# Patient Record
Sex: Female | Born: 1985 | Race: White | Hispanic: Yes | Marital: Married | Smoking: Never smoker
Health system: Southern US, Community
[De-identification: ages and names within clinical notes are randomized; demographics above are authoritative.]

## PROBLEM LIST (undated history)

## (undated) ENCOUNTER — Inpatient Hospital Stay (HOSPITAL_COMMUNITY): Payer: Self-pay

## (undated) DIAGNOSIS — E119 Type 2 diabetes mellitus without complications: Secondary | ICD-10-CM

## (undated) DIAGNOSIS — Z789 Other specified health status: Secondary | ICD-10-CM

## (undated) HISTORY — DX: Other specified health status: Z78.9

## (undated) HISTORY — DX: Type 2 diabetes mellitus without complications: E11.9

---
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
## 2003-02-12 ENCOUNTER — Inpatient Hospital Stay (HOSPITAL_COMMUNITY): Admission: AD | Admit: 2003-02-12 | Discharge: 2003-02-14 | Payer: Self-pay | Admitting: Family Medicine

## 2004-06-22 ENCOUNTER — Ambulatory Visit (HOSPITAL_COMMUNITY): Admission: RE | Admit: 2004-06-22 | Discharge: 2004-06-22 | Payer: Self-pay | Admitting: *Deleted

## 2004-10-04 ENCOUNTER — Ambulatory Visit: Payer: Self-pay | Admitting: Obstetrics and Gynecology

## 2004-10-04 ENCOUNTER — Inpatient Hospital Stay (HOSPITAL_COMMUNITY): Admission: AD | Admit: 2004-10-04 | Discharge: 2004-10-06 | Payer: Self-pay | Admitting: *Deleted

## 2006-03-12 ENCOUNTER — Ambulatory Visit (HOSPITAL_COMMUNITY): Admission: RE | Admit: 2006-03-12 | Discharge: 2006-03-12 | Payer: Self-pay | Admitting: Obstetrics & Gynecology

## 2006-04-13 ENCOUNTER — Ambulatory Visit: Payer: Self-pay | Admitting: Obstetrics and Gynecology

## 2006-04-13 ENCOUNTER — Inpatient Hospital Stay (HOSPITAL_COMMUNITY): Admission: AD | Admit: 2006-04-13 | Discharge: 2006-04-13 | Payer: Self-pay | Admitting: Obstetrics and Gynecology

## 2006-04-23 ENCOUNTER — Ambulatory Visit (HOSPITAL_COMMUNITY): Admission: RE | Admit: 2006-04-23 | Discharge: 2006-04-23 | Payer: Self-pay | Admitting: Obstetrics & Gynecology

## 2006-08-06 ENCOUNTER — Ambulatory Visit: Payer: Self-pay | Admitting: *Deleted

## 2006-08-06 ENCOUNTER — Inpatient Hospital Stay (HOSPITAL_COMMUNITY): Admission: AD | Admit: 2006-08-06 | Discharge: 2006-08-07 | Payer: Self-pay | Admitting: Obstetrics & Gynecology

## 2007-05-25 ENCOUNTER — Emergency Department (HOSPITAL_COMMUNITY): Admission: EM | Admit: 2007-05-25 | Discharge: 2007-05-25 | Payer: Self-pay | Admitting: Emergency Medicine

## 2009-11-29 ENCOUNTER — Ambulatory Visit (HOSPITAL_COMMUNITY)
Admission: RE | Admit: 2009-11-29 | Discharge: 2009-11-29 | Payer: Self-pay | Source: Home / Self Care | Admitting: Family Medicine

## 2009-12-27 ENCOUNTER — Ambulatory Visit (HOSPITAL_COMMUNITY)
Admission: RE | Admit: 2009-12-27 | Discharge: 2009-12-27 | Payer: Self-pay | Source: Home / Self Care | Attending: Family Medicine | Admitting: Family Medicine

## 2010-02-07 ENCOUNTER — Ambulatory Visit (HOSPITAL_COMMUNITY)
Admission: RE | Admit: 2010-02-07 | Discharge: 2010-02-07 | Payer: Self-pay | Source: Home / Self Care | Attending: Family Medicine | Admitting: Family Medicine

## 2010-04-26 ENCOUNTER — Other Ambulatory Visit: Payer: Self-pay | Admitting: Family Medicine

## 2010-04-27 ENCOUNTER — Ambulatory Visit (HOSPITAL_COMMUNITY)
Admission: RE | Admit: 2010-04-27 | Discharge: 2010-04-27 | Disposition: A | Discharge status: Home / Self Care | Payer: Medicaid Other | Source: Ambulatory Visit | Attending: Family Medicine | Admitting: Family Medicine

## 2010-04-27 DIAGNOSIS — Z3689 Encounter for other specified antenatal screening: Secondary | ICD-10-CM | POA: Insufficient documentation

## 2010-04-27 DIAGNOSIS — O3660X0 Maternal care for excessive fetal growth, unspecified trimester, not applicable or unspecified: Secondary | ICD-10-CM | POA: Insufficient documentation

## 2010-04-30 ENCOUNTER — Inpatient Hospital Stay (HOSPITAL_COMMUNITY): Admission: AD | Admit: 2010-04-30 | Payer: Self-pay | Admitting: Obstetrics & Gynecology

## 2010-05-01 ENCOUNTER — Other Ambulatory Visit: Payer: Self-pay | Admitting: Family Medicine

## 2010-05-01 DIAGNOSIS — O480 Post-term pregnancy: Secondary | ICD-10-CM

## 2010-05-01 DIAGNOSIS — IMO0002 Reserved for concepts with insufficient information to code with codable children: Secondary | ICD-10-CM

## 2010-05-03 ENCOUNTER — Inpatient Hospital Stay (HOSPITAL_COMMUNITY)
Admission: AD | Admit: 2010-05-03 | Discharge: 2010-05-03 | Disposition: A | Discharge status: Home / Self Care | Payer: Medicaid Other | Source: Ambulatory Visit | Attending: Family Medicine | Admitting: Family Medicine

## 2010-05-03 DIAGNOSIS — O4690 Antepartum hemorrhage, unspecified, unspecified trimester: Secondary | ICD-10-CM | POA: Insufficient documentation

## 2010-05-04 ENCOUNTER — Inpatient Hospital Stay (HOSPITAL_COMMUNITY)
Admission: AD | Admit: 2010-05-04 | Discharge: 2010-05-06 | DRG: 774 | Disposition: A | Discharge status: Home / Self Care | Payer: Medicaid Other | Source: Ambulatory Visit | Attending: Obstetrics and Gynecology | Admitting: Obstetrics and Gynecology

## 2010-05-04 DIAGNOSIS — O3660X0 Maternal care for excessive fetal growth, unspecified trimester, not applicable or unspecified: Secondary | ICD-10-CM | POA: Diagnosis present

## 2010-05-04 LAB — CBC
HCT: 34.3 % — ABNORMAL LOW (ref 36.0–46.0)
Hemoglobin: 11.1 g/dL — ABNORMAL LOW (ref 12.0–15.0)
MCH: 27.3 pg (ref 26.0–34.0)
MCHC: 32.4 g/dL (ref 30.0–36.0)
MCV: 84.5 fL (ref 78.0–100.0)
Platelets: 167 10*3/uL (ref 150–400)
RBC: 4.06 MIL/uL (ref 3.87–5.11)
RDW: 15 % (ref 11.5–15.5)
WBC: 10.5 10*3/uL (ref 4.0–10.5)

## 2010-05-04 LAB — RPR: RPR Ser Ql: NONREACTIVE

## 2010-05-04 LAB — ABO/RH: ABO/RH(D): O POS

## 2010-05-04 LAB — GLUCOSE, CAPILLARY: Glucose-Capillary: 134 mg/dL — ABNORMAL HIGH (ref 70–99)

## 2010-05-05 ENCOUNTER — Other Ambulatory Visit (HOSPITAL_COMMUNITY): Payer: Medicaid Other

## 2010-05-05 ENCOUNTER — Ambulatory Visit (HOSPITAL_COMMUNITY): Admission: RE | Admit: 2010-05-05 | Payer: Medicaid Other | Source: Ambulatory Visit

## 2010-05-05 LAB — CBC
HCT: 31.9 % — ABNORMAL LOW (ref 36.0–46.0)
Hemoglobin: 10.1 g/dL — ABNORMAL LOW (ref 12.0–15.0)
MCH: 26.9 pg (ref 26.0–34.0)
MCHC: 31.7 g/dL (ref 30.0–36.0)
MCV: 84.8 fL (ref 78.0–100.0)
Platelets: 173 10*3/uL (ref 150–400)
RBC: 3.76 MIL/uL — ABNORMAL LOW (ref 3.87–5.11)
RDW: 15.6 % — ABNORMAL HIGH (ref 11.5–15.5)
WBC: 15.8 10*3/uL — ABNORMAL HIGH (ref 4.0–10.5)

## 2010-05-05 LAB — CREATININE, SERUM
Creatinine, Ser: 0.58 mg/dL (ref 0.4–1.2)
GFR calc Af Amer: >60 mL/min (ref >60)
GFR calc non Af Amer: >60 mL/min (ref >60)

## 2010-05-12 NOTE — Op Note (Signed)
  NAMEALCIE, RUNIONS        ACCOUNT NO.:  [id number]  MEDICAL RECORD NO.:  [id number]           PATIENT TYPE:  I  LOCATION:  9103                          FACILITY:  [facility]  PHYSICIAN:  Lucina Mellow, DO   DATE OF BIRTH:  09-19-1985  DATE OF PROCEDURE:  05/04/2010 DATE OF DISCHARGE:                              OPERATIVE REPORT   The patient is a 25 year old gravida 4, para 3-0-0-3 at 40.2 weeks who presented in active labor.  The patient had been noted to have a large for gestational age baby on an ultrasound 1 week ago way weighing at 9 pounds 3 ounces.   The patient had been pushing for approximately 20 minutes and was noted to have a small amount of "turtling" and additional help was in the room at the time of delivery.  The patient was pushing with 4 milliunits per minute of Pitocin running in her IV.  There was 1 minute 33 seconds of shoulder dystocia. The time of birth was 12:48. The infant presention for delivery was in a right occiput anterior position.  The infant's face was looking towards the mother's left leg such that the anterior shoulder could not be delivered.  I placed my hand into the vagina posteriorly, was able to deliver the posterior arm. The infant was then rotated clockwise and delivered the shoulders almost exactly transverse. Baby came out and did not cry, and was limp, and was handed immediately to the pediatricians. The Apgars are still being decided and determined at the time of this dictation.    There had been thick meconium noted at [facility] when the patient was determined to be complete at 9:35 this morning.  The pediatrician stated that they did not pull any meconium past the vocal cord.  The infant eventually cried and was noted to be moving his right arm a small amount but his left arm very little is it all.  Blood gases, both arterial and venous were obtained as well as the cord blood sample was obtained.  The placenta delivered  spontaneously and intact at 12:55.  Exploration noted that the uterus was somewhat boggy.  Clots were removed and exploration of the fundus without difficulty; 1000 mcg of Cytotec were placed rectally.  A first degree perineal tear was repaired in the usual fashion with 3-0 Vicryl.  Estimated blood loss was 750 mL.  After the repair, the uterus was noted to be firm and no more clots were to be removed.  Infant's weight was 10 pounds even. Mother plans to breastfeed, 24 hours of antibiotics of clindamycin and gentamicin will be provided due to the internal uterine exploration for clot removal. The patient has been counseled that if subsequent pregnancies determined to be this large, she may be offered a primary cesarean section to avoid shoulder dystocia of subsequent babies.  The patient voiced understanding at this time but we will review this upon discharge.          ______________________________ Lucina Mellow, DO     SH/MEDQ  D:  05/04/2010  T:  05/05/2010  Job:  [id number]  Electronically Signed by Lucina Mellow MD on 05/12/2010 07:57:06 PM

## 2010-10-23 LAB — CBC
HCT: 33.1 — ABNORMAL LOW
Hemoglobin: 11.2 — ABNORMAL LOW
MCHC: 33.9
MCV: 87.5
Platelets: 204
RBC: 3.78 — ABNORMAL LOW
RDW: 13.9
WBC: 8.8

## 2010-10-23 LAB — RPR: RPR Ser Ql: NONREACTIVE

## 2011-05-29 IMAGING — US US OB DETAIL+14 WK
2 series · 16 of 28 positions shown · non-contrast
Comparison: none

[Series 1: us ob detail +14 wk · 15 of 101 slices shown (1 of 2)]
[im 1/101]
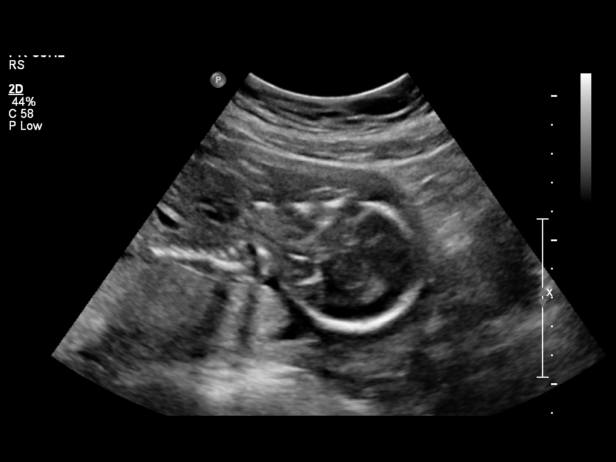
[im 8/101]
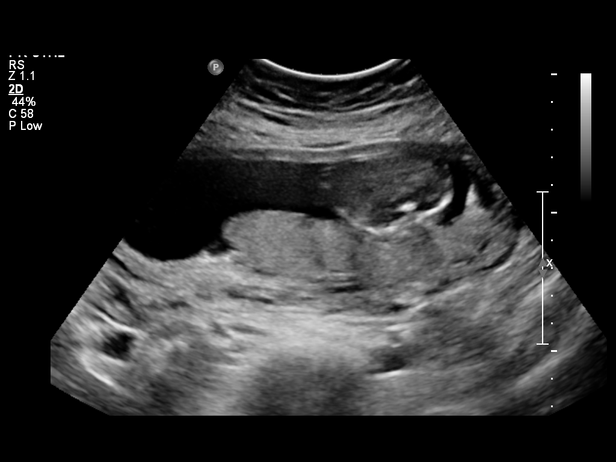
[im 16/101]
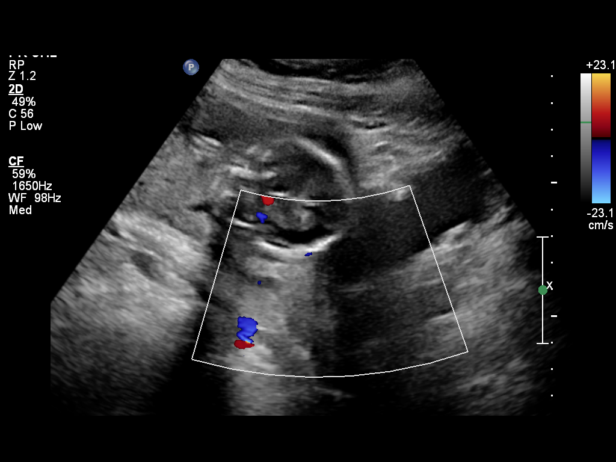
[im 24/101]
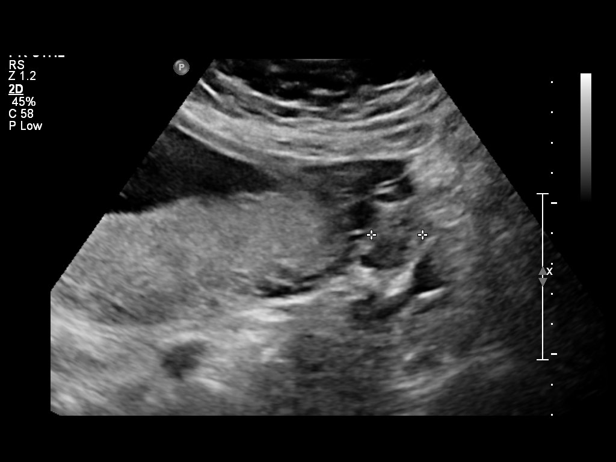
[im 27/101]
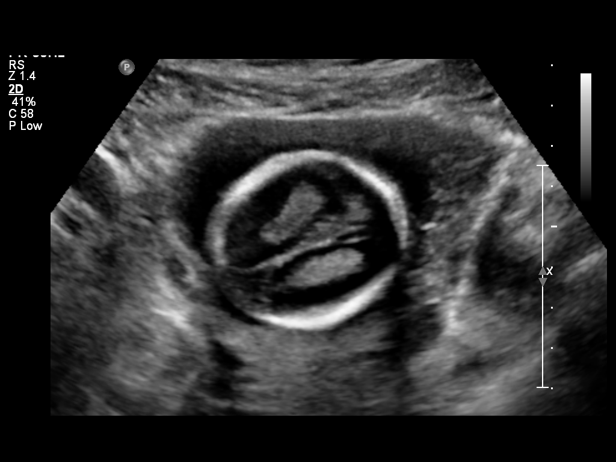
[im 35/101]
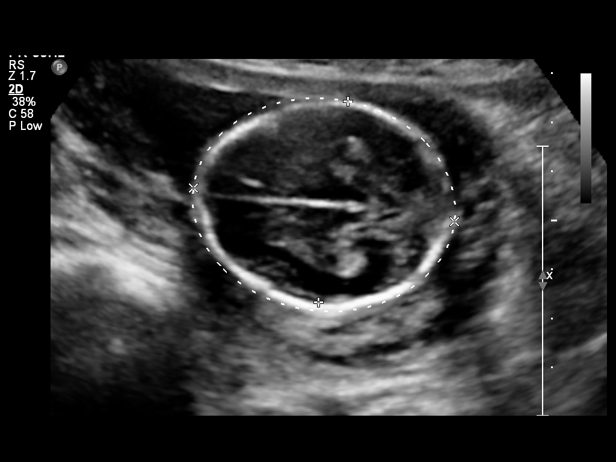
[im 43/101]
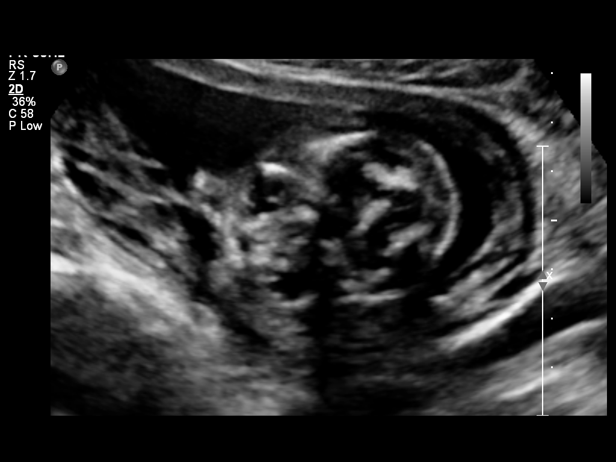
[im 51/101]
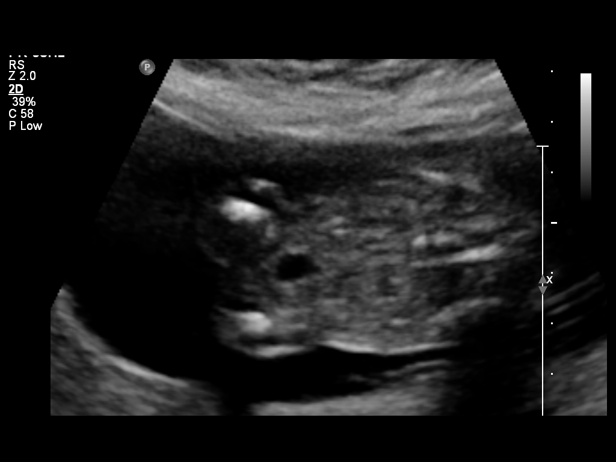
[im 54/101]
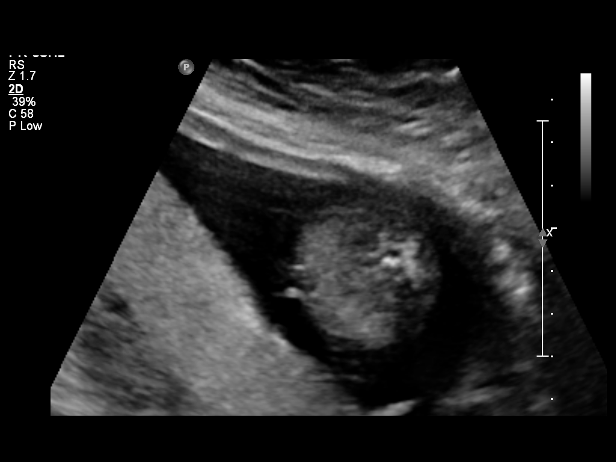
[im 62/101]
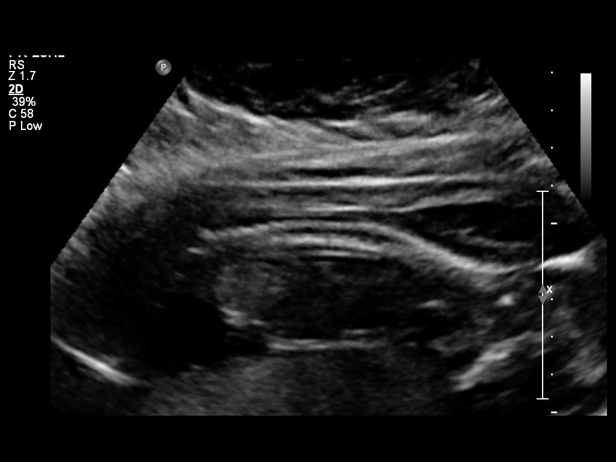
[im 70/101]
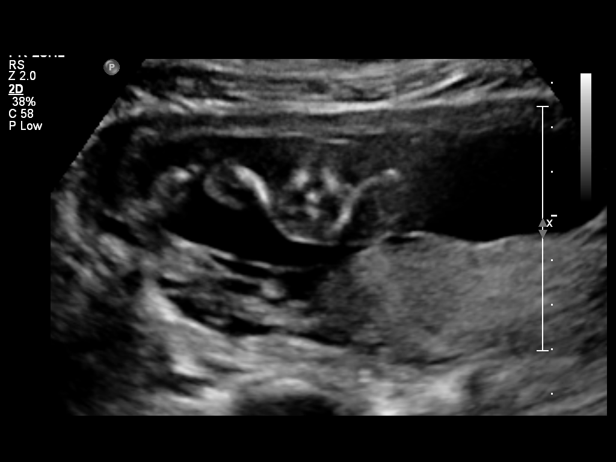
[im 77/101]
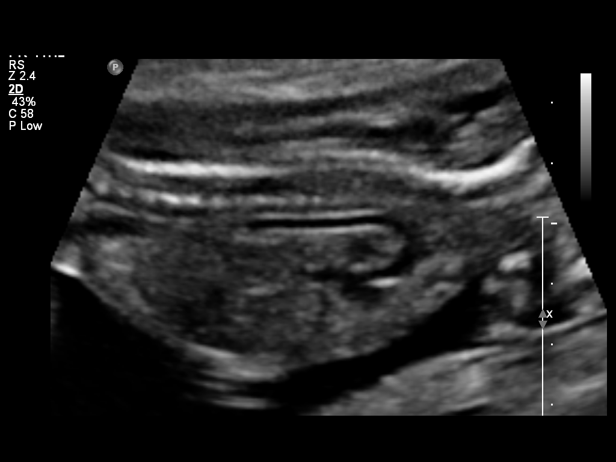
[im 81/101]
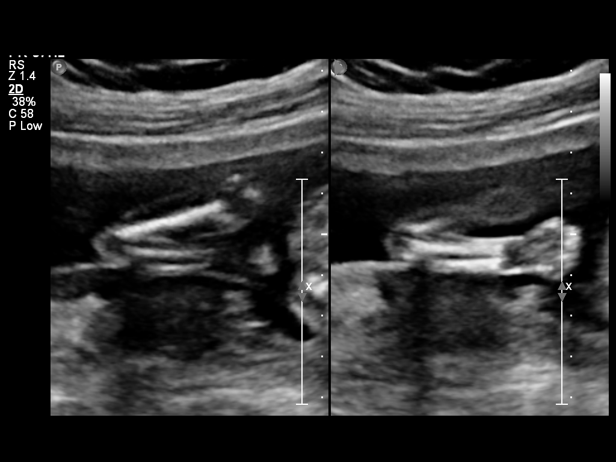
[im 89/101]
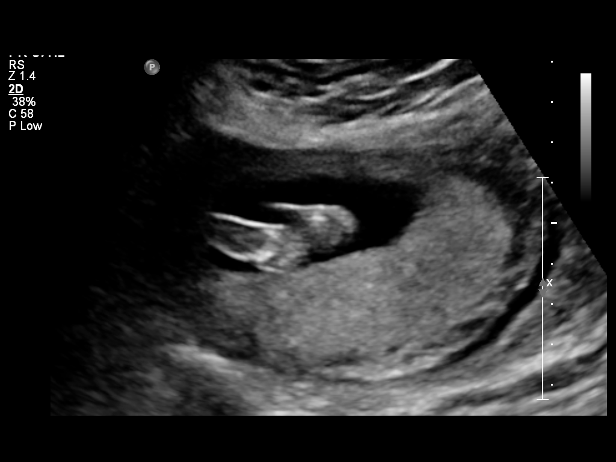
[im 97/101]
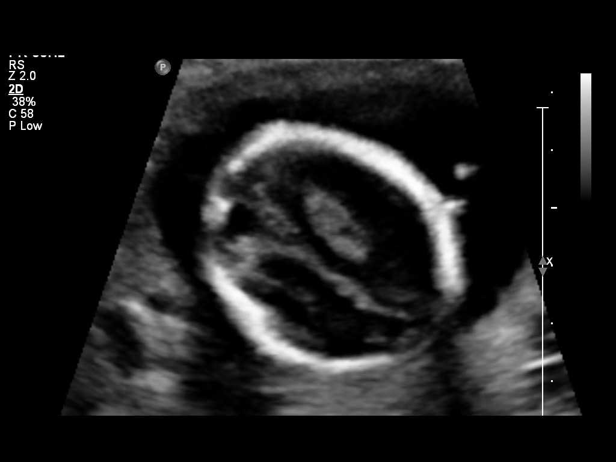

[Series 1: us ob detail +14 wk · 1 of 4 slices shown (2 of 2)]
[im 1/4]
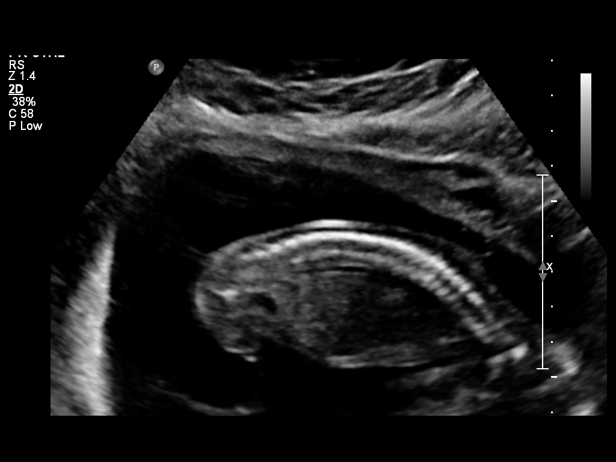

[16 of 28 positions shown; findings below may reference images not displayed]

OBSTETRICS REPORT
                      (Signed Final 11/29/2009 [DATE])

                 [REDACTED]-
                 Faculty Physician
 Order#:         _[id number]
Procedures

 US OB DETAIL +14 WK                                   76811.0
Indications

 Detailed fetal anatomic survey
 Unsure of LMP;  Establish Gestational [AGE]
 Cholecystectomy
Fetal Evaluation

 Fetal Heart Rate:  142                          bpm
 Cardiac Activity:  Observed
 Presentation:      Cephalic
 Placenta:          Posterior, above cervical
                    os
 P. Cord            Visualized
 Insertion:

 Amniotic Fluid
 AFI FV:      Subjectively within normal limits
                                             Larg Pckt:       4  cm
Biometry

 BPD:     41.1  mm     G. Age:  18w 3d                CI:        74.71   70 - 86
                                                      FL/HC:      17.2   15.8 -
                                                                         18
 HC:     150.9  mm     G. Age:  18w 1d       35  %    HC/AC:      1.21   1.07 -

 AC:     124.2  mm     G. Age:  18w 0d       39  %    FL/BPD:
 FL:      25.9  mm     G. Age:  17w 6d       29  %    FL/AC:      20.9   20 - 24
 HUM:     25.1  mm     G. Age:  17w 6d       41  %
 CER:       18  mm     G. Age:  18w 0d       39  %

 Est. FW:     219  gm      0 lb 8 oz     41  %
Gestational Age

 LMP:           18w 2d        Date:  07/24/09                 EDD:   04/30/10
 U/S Today:     18w 1d                                        EDD:   05/01/10
 Best:          18w 2d     Det. By:  LMP  (07/24/09)          EDD:   04/30/10
Genetic Sonogram - Trisomy 21 Screening

 Age:                                             26          Risk=1:   826
 Echogenic bowel:                                 No          LR :
 Choroid plexus cysts:                            Yes
 Structural anomalies (inc. cardiac):             No          LR :
 Short femur:                                     No          LR :
 Wide space 0st-5nd toes:                         No
 Short humerus:                                   No          LR :
 2-vessel umbilical cord:                         No
 Pyelectasis:                                     No          LR :
 Echogenic cardiac foci:                          No          LR :

 9 Of 9 Criteria Were Visualized and 1 Abnormal(s) Were Seen.
 Ultrasound Modified Risk for Fetal Down Syndrome = [DATE]
Anatomy

 Cranium:           Appears normal      Aortic Arch:       Appears normal
 Fetal Cavum:       Appears normal      Ductal Arch:       Appears normal
 Ventricles:        Appears normal      Diaphragm:         Appears normal
 Choroid Plexus:    Bilateral           Stomach:           Appears
                    choroid plexus
                    cysts
                                                           normal, left
                                                           sided
 Cerebellum:        Appears normal      Abdomen:           Appears normal
 Posterior Fossa:   Appears normal      Abdominal Wall:    Appears nml
                                                           (cord insert,
                                                           abd wall)
 Nuchal Fold:       Appears normal      Cord Vessels:      Appears normal
                    (neck, nuchal                          (3 vessel cord)
                    fold)
 Face:              Orbits appear       Kidneys:           Appear normal
                    normal
 Heart:             Appears normal      Bladder:           Appears normal
                    (4 chamber &
                    axis)
 RVOT:              Not well            Spine:             Appears normal
                    visualized
 LVOT:              Appears normal      Limbs:             Four extremities
                                                           seen

 Other:     Fetus appears to be a male. Heels visualized.  Choroid
            plexus cysts are associated with mildly increased risk for
            Trisomy 18, but are seen in 1-2% of normal fetuses.  As
            an isolated finding, this is of doubtful clinical significance.
Cervix Uterus Adnexa

 Cervical Length:    3.5      cm

 Cervix:       Normal appearance by transabdominal scan.

 Left Ovary:    Within normal limits.
 Right Ovary:   Within normal limits.
 Adnexa:     No abnormality visualized.
Impression

 cbd  Single living intrauterine gestation with concordant
 gestational age and normal visualized anatomy.

   Incidental note is made of 2mm choroid plexus cysts.
 These can be associated with mildly increased risk for
 Trisomy 18, but are seen in 1-2% of normal fetuses.  As an
 isolated finding, this is of doubtful clinical significance.

 AMAZIGH with us.  Please do not hesitate to contact

## 2011-10-25 IMAGING — US US OB FOLLOW-UP
1 series · 12 of 28 positions shown · non-contrast
Comparison: none

[Series 1: us ob follow up · 34 acquisitions, 12 frames shown]
[im 2/34]
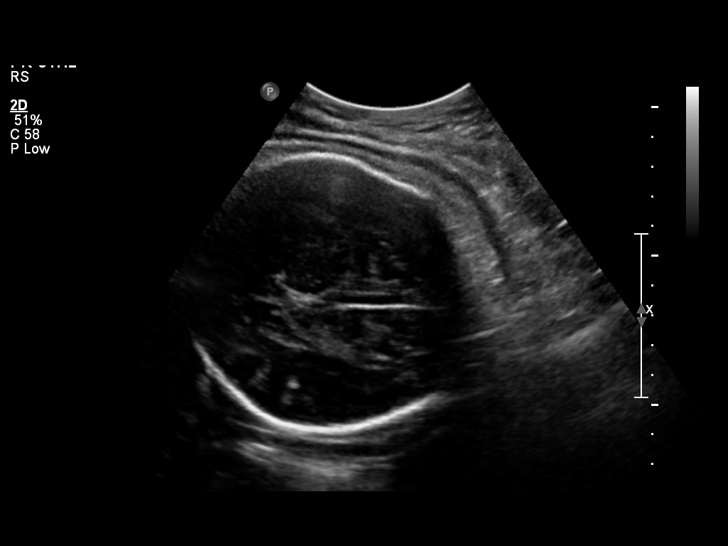
[im 4/34]
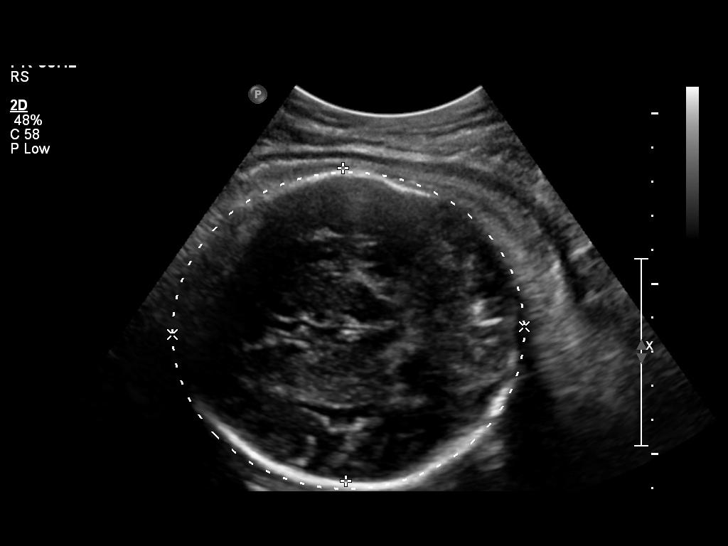
[im 7/34]
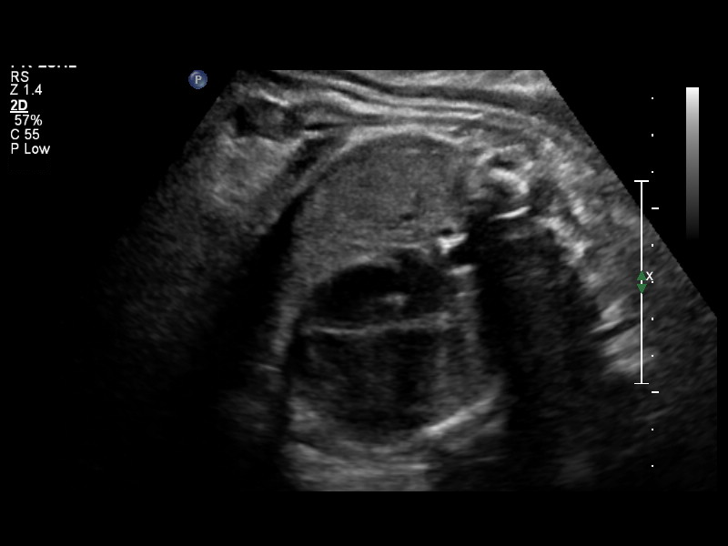
[im 10/34]
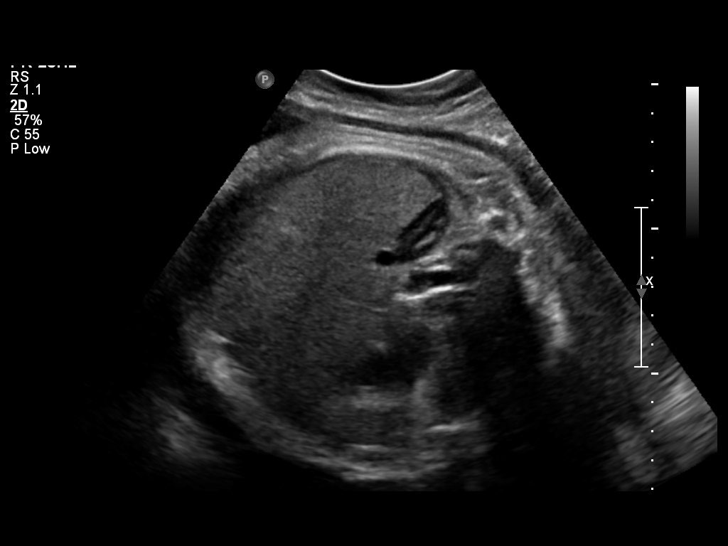
[im 13/34]
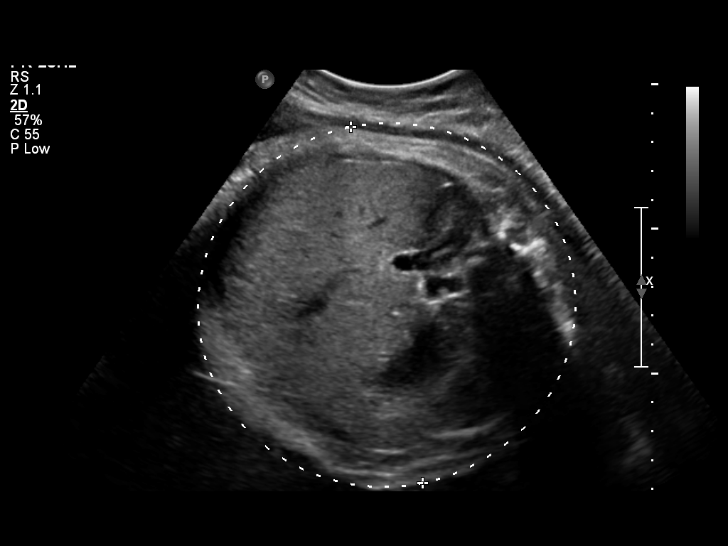
[im 15/34]
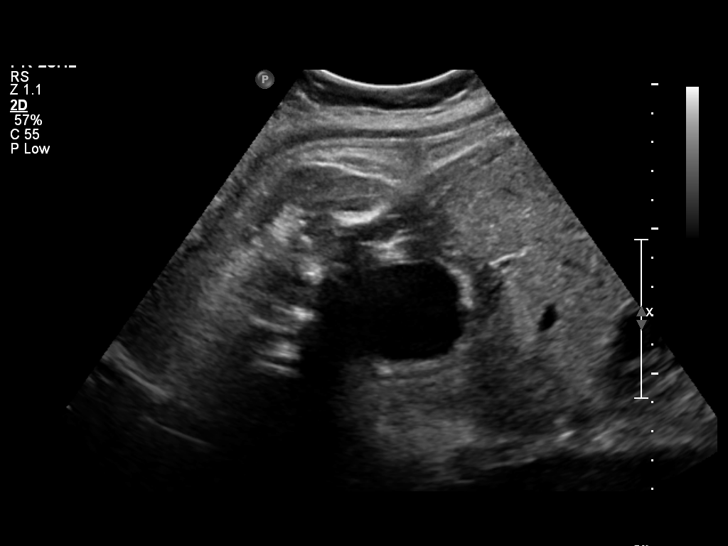
[im 19/34]
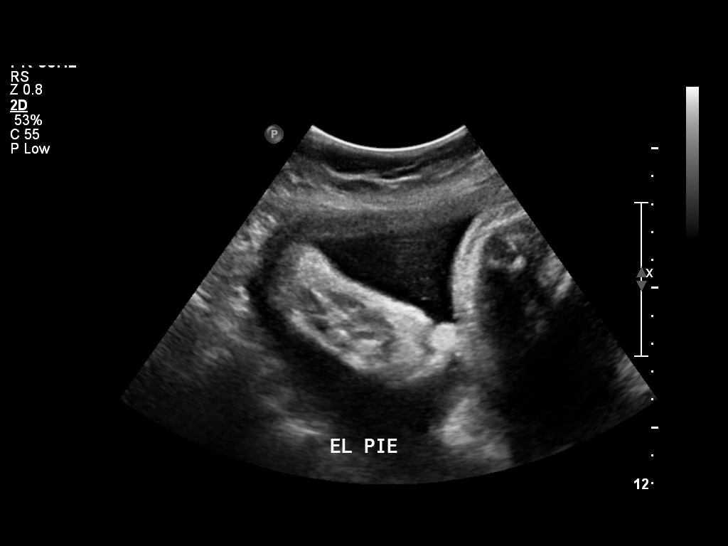
[im 21/34]
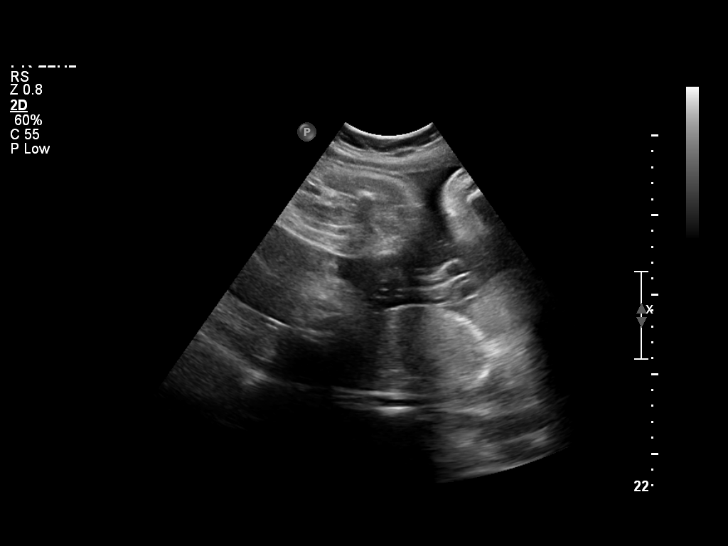
[im 24/34]
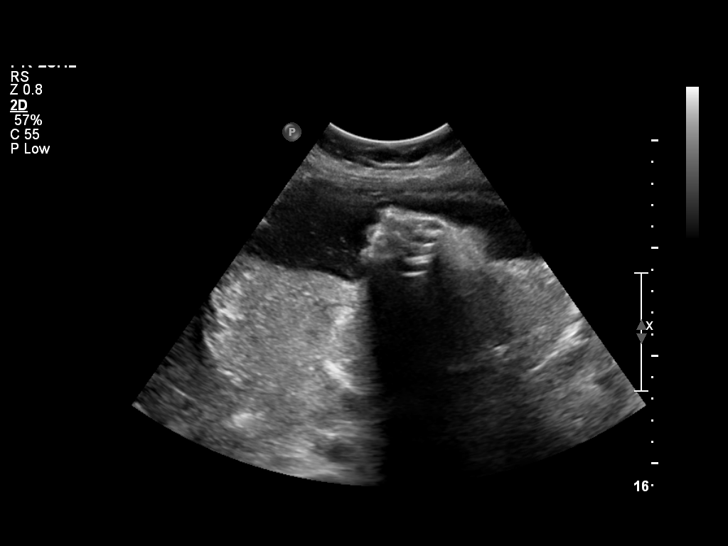
[im 27/34]
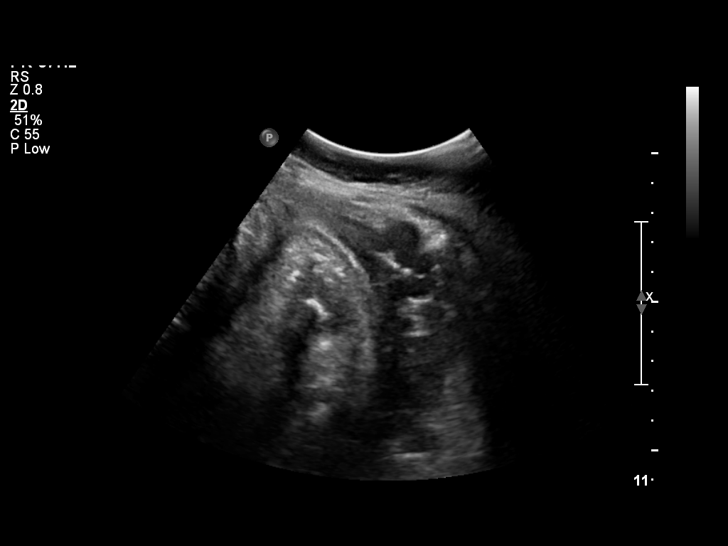
[im 30/34]
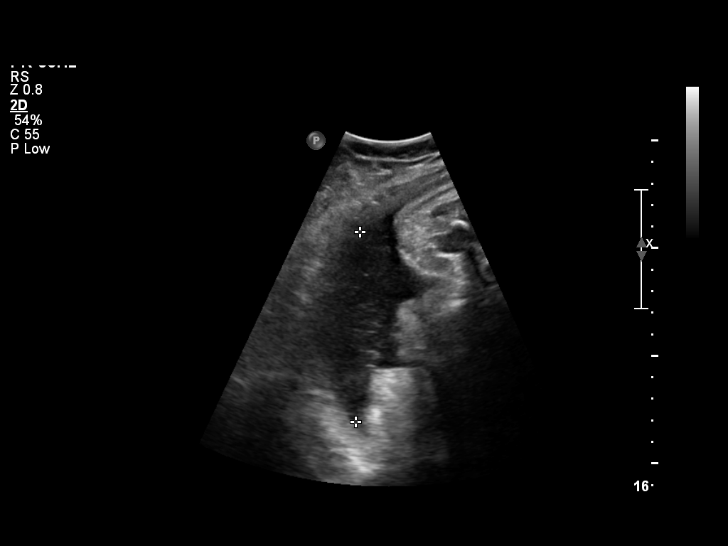
[im 32/34]
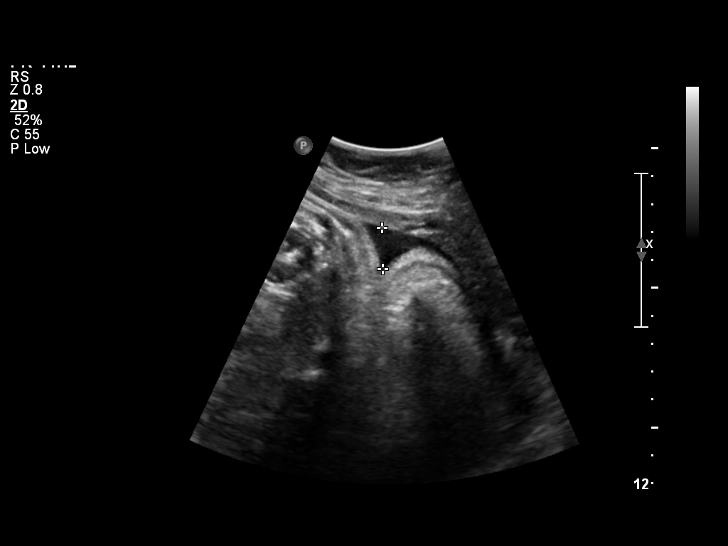

[12 of 28 positions shown; findings below may reference images not displayed]

OBSTETRICS REPORT
                      (Signed Final 04/27/2010 [DATE])

           IRVIN

 Order#:         [id number]
Procedures

 US OB FOLLOW UP                                       76816.1
Indications

 Assess Fetal Growth / Estimated Fetal Weight
 Size greater than dates (Large for gestational [AGE]
Fetal Evaluation

 Fetal Heart Rate:  143                          bpm
 Cardiac Activity:  Observed
 Presentation:      Cephalic
 Placenta:          Posterior, above cervical
                    os
 P. Cord            Previously Visualized
 Insertion:

 Amniotic Fluid
 AFI FV:      Subjectively within normal limits
 AFI Sum:     13.77   cm       56  %Tile     Larg Pckt:    8.79  cm
 RUQ:   3.51    cm   RLQ:    1.47   cm    LLQ:   8.79    cm
Biometry

 BPD:     90.6  mm     G. Age:  36w 5d                CI:         82.1   70 - 86
                                                      FL/HC:      23.3   20.7 -

 HC:     315.5  mm     G. Age:  35w 3d      < 3  %    HC/AC:      0.79   0.87 -

 AC:     399.1  mm     G. Age:  N/A        > 97  %    FL/BPD:     81.2   71 - 87
 FL:      73.6  mm     G. Age:  37w 5d       17  %    FL/AC:      18.4   20 - 24

 Est. FW:    0665  gm      9 lb 3 oz   > 90  %
Gestational Age

 LMP:           39w 4d        Date:  07/24/09                 EDD:   04/30/10
 U/S Today:     36w 4d                                        EDD:   05/21/10
 Best:          39w 4d     Det. By:  LMP  (07/24/09)          EDD:   04/30/10
Anatomy

 Cranium:           Appears normal      Aortic Arch:       Previously seen
 Fetal Cavum:       Previously seen     Ductal Arch:       Previously seen
 Ventricles:        Appears normal      Diaphragm:         Previously seen
 Choroid Plexus:    Previously seen     Stomach:           Appears
                                                           normal, left
                                                           sided
 Cerebellum:        Previously seen     Abdomen:           Appears normal
 Posterior Fossa:   Previously seen     Abdominal Wall:    Previously seen
 Nuchal Fold:       Previously seen     Cord Vessels:      Previously seen
 Face:              Previously seen     Kidneys:           Appear normal
 Heart:             Not well            Bladder:           Appears normal
                    visualized
 RVOT:              Previously seen     Spine:             Previously seen
 LVOT:              Previously seen     Limbs:             Previously seen

 Other:     Heels previously visualized. Technically difficult due to
            fetal position and advanced GA.
Cervix Uterus Adnexa

 Cervix:       Not visualized (advanced GA >34 wks)
 Uterus:       No abnormality visualized.
 Left Ovary:    Not visualized.
 Right Ovary:   Within normal limits.

 Adnexa:     No abnormality visualized.
Comments

 EFW range (+ / - 15%) =  3554 - 4597 grs.
Impression

 Single intrauterine gestation demonstrating an estimated
 gestational age by ultrasound of 36w 4d. This is correlated
 with expected estimated gestational age by LMP of 39w 4d.
 The AC is larger than the remaining gestational indicators
 resulting in an EFW > 90% and compatible with a LGA fetus.
 With an EFW range exceeding 8755 grams, the possibility of
 underlying fetal macrosomia would need to be raised.

 No late developing fetal anatomic abnormalities are noted
 associated with the lateral ventricles, stomach, kidneys or
 bladder. The four chamber heart could not be well assessed
 due to fetal positioning combined with advanced gestational
 age.

 Subjectively and quantitatively normal amniotic fluid volume.

## 2013-03-07 ENCOUNTER — Encounter (HOSPITAL_COMMUNITY): Payer: Self-pay | Admitting: Emergency Medicine

## 2013-03-07 ENCOUNTER — Emergency Department (INDEPENDENT_AMBULATORY_CARE_PROVIDER_SITE_OTHER)
Admission: EM | Admit: 2013-03-07 | Discharge: 2013-03-07 | Disposition: A | Discharge status: Home / Self Care | Payer: Self-pay | Source: Home / Self Care | Attending: Emergency Medicine | Admitting: Emergency Medicine

## 2013-03-07 DIAGNOSIS — L739 Follicular disorder, unspecified: Secondary | ICD-10-CM

## 2013-03-07 DIAGNOSIS — L259 Unspecified contact dermatitis, unspecified cause: Secondary | ICD-10-CM

## 2013-03-07 MED ORDER — CEPHALEXIN 500 MG PO CAPS: 500.0000 mg | ORAL_CAPSULE | Freq: Three times a day (TID) | ORAL | Status: DC

## 2013-03-07 MED ORDER — HYDROCORTISONE 1 % EX CREA: TOPICAL_CREAM | CUTANEOUS | Status: DC

## 2013-03-07 NOTE — Discharge Instructions (Signed)
Dermatitis de contacto (Contact Dermatitis) La dermatitis de contacto es una reaccin a ciertas sustancias que tocan la piel. Puede ser Ardelia Mems dermatitis de contacto irritante o alrgica. La dermatitis de contacto irritante no requiere exposicin previa a la sustancia que provoc la reaccin.La dermatitis alrgica slo ocurre si ha estado expuesto anteriormente a la sustancia. Al repetir la exposicin, el organismo reacciona a la sustancia.  CAUSAS  Muchas sustancias pueden causar dermatitis de contacto. La dermatitis irritante se produce cuando hay exposicin repetida a sustancias levemente irritantes, como por ejemplo:   Maquillaje.  Jabones.  Detergentes.  Lavandina.  cidos.  Sales metlicas, como el nquel. Las causas de la dermatitis alrgica son:   Plantas venenosas.  Sustancias qumicas (desodorantes, champs).  Bijouterie.  Ltex.  Neomicina en cremas con triple antibitico.  Conservantes en productos incluyendo en la ropa. SNTOMAS  En la zona de la piel que ha estado expuesta puede haber:   Sequedad o descamacin.  Enrojecimiento.  Grietas.  Picazn.  Dolor o sensacin de ardor.  Ampollas. En el caso de la dermatitis de Risk manager, puede haber slo hinchazn en algunas zonas, como la boca o los genitales.  DIAGNSTICO  El mdico podr hacer el diagnstico realizando un examen fsico. En los casos en que la causa es incierta y se sospecha una dermatitis de [place], le har una prueba en la piel con un parche para determinar la causa de la dermatitis. TRATAMIENTO  El tratamiento incluye la proteccin de la piel de nuevos contactos con la sustancia irritante, evitando la sustancia en lo posible. Puede ser de utilidad colocar una barrera como cremas, polvos y [place]. El mdico tambin podr recomendar:   Cremas o pomadas con corticoides aplicadas 2 veces por da. Para un mejor efecto, humedezca la zona con agua fresca durante 20 minutos. Luego aplique  el medicamento. Cubra la zona con un vendaje plstico. Puede almacenar la crema con corticoides en el refrigerador para Research scientist (medical) "refrescante" sobre la erupcin que har aliviar la picazn. Esto aliviar la picazn. En los casos ms graves ser necesario aplicar corticoides por va oral.  Ungentos con antibiticos o antibacterianos, si hay una infeccin en la piel.  Antihistamnicos en forma de locin o por va oral para calmar la picazn.  Lubricantes para mantener la humectacin de la piel.  La solucin de [place] para reducir el enrojecimiento y Conservation officer, historic buildings o para secar una erupcin que supura. Mezcle un paquete o tableta en dos tazas de agua fra. Moje un pao limpio en la solucin, escrralo un poco y colquelo en el rea afectada. Djelo en el lugar durante 30 minutos. Repita el procedimiento todas las veces que pueda a lo largo del Training and development officer.  Hgase baos con almidn o bicarbonato todos los das si la zona es demasiado extensa como para cubrirla con una toallita. Algunas sustancias qumicas, como los lcalis o los cidos pueden daar la piel del mismo modo que [place]. Enjuague la piel durante 15 a 20 minutos con agua fra despus de la exposicin a esas sustancias. Tambin busque atencin mdica de inmediato. En los casos de piel muy irritada, ser necesario aplicar (vendajes), antibiticos y analgsicos.  INSTRUCCIONES PARA EL CUIDADO EN EL HOGAR   Evite lo que ha causado la erupcin.  Mantenga el rea de la piel afectada sin contacto con el agua caliente, el jabn, la luz solar, las sustancias qumicas, sustancias cidas o todo lo que la irrite.  No se rasque la lesin. El rascado [employer] la  erupcin se infecte.  Puede tomar baos con agua fresca para detener la picazn.  Tome slo medicamentos de venta libre o recetados, segn las indicaciones del mdico.  Concurra a las visitas de control segn las indicaciones, para asegurarse de que la piel se est curando  adecuadamente. SOLICITE ATENCIN MDICA SI:   El problema no mejora luego de 3 das de tratamiento.  Se siente empeorar.  Observa signos de infeccin, como hinchazn, sensibilidad, inflamacin, enrojecimiento o aumenta la temperatura en la zona afectada.  Tiene nuevos problemas debido a los medicamentos. Document Released: 10/04/2004 Document Revised: 03/19/2011 [employer] Patient Information 2014 [employer].  

## 2013-03-07 NOTE — ED Notes (Signed)
Pt. Stated, I've had a rash on my nose since Thursday. Today it seems worse.

## 2013-03-07 NOTE — ED Provider Notes (Signed)
  Chief Complaint    Chief Complaint  Patient presents with  . Rash    History of Present Illness      Drema Halonlicia Delarosa is a 28 year old female who had a small, inflamed bump on the bridge of the nose 3 days ago. She but applied some homeopathic remedy, then tried Neosporin. Since then there is a rash surrounding the inflamed bump. There's been no purulent drainage.  Review of Systems   Other than as noted above, the patient denies any of the following symptoms: Systemic:  No fever, chills, or myalgias. ENT:  No nasal congestion, rhinorrhea, sore throat, swelling of lips, tongue or throat. Resp:  No cough, wheezing, or shortness of breath.  PMFSH    Past medical history, family history, social history, meds, and allergies were reviewed.   Physical Exam     Vital signs:  LMP 03/05/2013 Gen:  Alert, oriented, in no distress. ENT:  Pharynx clear, no intraoral lesions, moist mucous membranes. Lungs:  Clear to auscultation. Skin:  She has a couple of small complaint bumps on her face. The biggest of these in the bridge of the nose and has a crust on it. There is some surrounding erythema, and a fine maculopapular rash extending about a centimeter in all.  Assessment    The primary encounter diagnosis was Contact dermatitis. A diagnosis of Folliculitis was also pertinent to this visit.  I think the patient had a small pimple or area of folliculitis. The medicine she put on it treated have caused her to break out.  Plan     1.  Meds:  The following meds were prescribed:   Discharge Medication List as of 03/07/2013 10:32 AM    START taking these medications   Details  cephALEXin (KEFLEX) 500 MG capsule Take 1 capsule (500 mg total) by mouth 3 (three) times daily., Starting 03/07/2013, Until Discontinued, Normal    hydrocortisone cream 1 % Apply to affected area 3 times daily, Normal        2.  Patient Education/Counseling:  The patient was given appropriate handouts, self  care instructions, and instructed in symptomatic relief.  She was told not to use any of the previous medicines that she had been using. Suggested moist, warm compresses, given Keflex and hydrocortisone cream.  3.  Follow up:  The patient was told to follow up here if no better in 3 to 4 days, or sooner if becoming worse in any way, and given some red flag symptoms such as worsening rash, fever, or difficulty breathing which would prompt immediate return.  Follow up here if necessary.      Reuben Likesavid C Senie Lanese, MD 03/07/13 1131

## 2020-08-19 ENCOUNTER — Encounter (HOSPITAL_COMMUNITY): Payer: Self-pay | Admitting: Emergency Medicine

## 2020-08-19 ENCOUNTER — Other Ambulatory Visit: Payer: Self-pay

## 2020-08-19 ENCOUNTER — Ambulatory Visit (HOSPITAL_COMMUNITY)
Admission: EM | Admit: 2020-08-19 | Discharge: 2020-08-19 | Disposition: A | Discharge status: Home / Self Care | Payer: Self-pay | Attending: Student | Admitting: Student

## 2020-08-19 DIAGNOSIS — L0100 Impetigo, unspecified: Secondary | ICD-10-CM

## 2020-08-19 MED ORDER — MUPIROCIN CALCIUM 2 % EX CREA: 1.0000 "application " | TOPICAL_CREAM | Freq: Two times a day (BID) | CUTANEOUS | 0 refills | Status: AC

## 2020-08-19 MED ORDER — METHYLPREDNISOLONE SODIUM SUCC 125 MG IJ SOLR
60.0000 mg | Freq: Once | INTRAMUSCULAR | Status: AC
  Administered 2020-08-19: 60 mg via INTRAMUSCULAR

## 2020-08-19 MED ORDER — METHYLPREDNISOLONE SODIUM SUCC 125 MG IJ SOLR
INTRAMUSCULAR | Status: AC
  Filled 2020-08-19: qty 2

## 2020-08-19 NOTE — ED Triage Notes (Addendum)
Facial erythematous, dry, itching rash ongoing x 3 days. Denies pain, drainage. Isolated, raised patches. Denies fever. Says the last time something similar happened, this rash broke out over her cheeks and she was seen at the hospital 16 years ago. Says they treated her for allergies, reports having been given a prescription for it and the rash improved.

## 2020-08-19 NOTE — Discharge Instructions (Addendum)
-  Mupirocin cream 2x daily x7 days -Wash with gentle soap and water only -Come back and see [place] if symptoms getting worse instead of better

## 2020-08-19 NOTE — ED Provider Notes (Signed)
[facility]    CSN: [id number] Arrival date & time: 08/19/20  1015      History   Chief Complaint Chief Complaint  Patient presents with   Rash    HPI Gabriela Rivera is a 35 y.o. female presenting with rash x3 days. Medical history similar in the past though she does not know the diagnosis or the treatment that was given. Describes this as an itchy facial rash.  Denies triggers including new products, outdoor exposure.  Denies pain, drainage, rash elsewhere.  Denies fever/chills.  Has not tried any medications for the symptoms.  HPI  History reviewed. No pertinent past medical history.  There are no problems to display for this patient.   History reviewed. No pertinent surgical history.  OB History   No obstetric history on file.      Home Medications    Prior to Admission medications   Medication Sig Start Date End Date Taking? Authorizing Provider  mupirocin cream (BACTROBAN) 2 % Apply 1 application topically 2 (two) times daily for 7 days. 08/19/20 08/26/20 Yes Rhys Martini, PA-C  cephALEXin (KEFLEX) 500 MG capsule Take 1 capsule (500 mg total) by mouth 3 (three) times daily. 03/07/13   Reuben Likes, MD  hydrocortisone cream 1 % Apply to affected area 3 times daily 03/07/13   Reuben Likes, MD    Family History History reviewed. No pertinent family history.  Social History Social History   Tobacco Use   Smoking status: Never   Smokeless tobacco: Never  Substance Use Topics   Alcohol use: No   Drug use: No     Allergies   Patient has no known allergies.   Review of Systems Review of Systems  Skin:  Positive for rash.  All other systems reviewed and are negative.   Physical Exam Triage Vital Signs ED Triage Vitals  Enc Vitals Group     BP 08/19/20 1034 130/79     Pulse Rate 08/19/20 1034 68     Resp 08/19/20 1034 16     Temp 08/19/20 1034 98.4 F (36.9 C)     Temp Source 08/19/20 1034 Oral     SpO2 08/19/20 1034 99 %      Weight --      Height --      Head Circumference --      Peak Flow --      Pain Score 08/19/20 1036 0     Pain Loc --      Pain Edu? --      Excl. in GC? --    No data found.  Updated Vital Signs BP 130/79 (BP Location: Right Arm)   Pulse 68   Temp 98.4 F (36.9 C) (Oral)   Resp 16   SpO2 99%   Visual Acuity Right Eye Distance:   Left Eye Distance:   Bilateral Distance:    Right Eye Near:   Left Eye Near:    Bilateral Near:     Physical Exam Vitals reviewed.  Constitutional:      General: She is not in acute distress.    Appearance: Normal appearance. She is not ill-appearing or diaphoretic.  HENT:     Head: Normocephalic and atraumatic.  Cardiovascular:     Rate and Rhythm: Normal rate and regular rhythm.     Heart sounds: Normal heart sounds.  Pulmonary:     Effort: Pulmonary effort is normal.     Breath sounds: Normal breath sounds.  Skin:    General: Skin is warm.     Comments: See images below Face with few patches of warm erythematous raised rash. No fluctuance. No discharge or crusting.  Neurological:     General: No focal deficit present.     Mental Status: She is alert and oriented to person, place, and time.  Psychiatric:        Mood and Affect: Mood normal.        Behavior: Behavior normal.        Thought Content: Thought content normal.        Judgment: Judgment normal.         UC Treatments / Results  Labs (all labs ordered are listed, but only abnormal results are displayed) Labs Reviewed - No data to display  EKG   Radiology No results found.  Procedures Procedures (including critical care time)  Medications Ordered in UC Medications  methylPREDNISolone sodium succinate (SOLU-MEDROL) 125 mg/2 mL injection 60 mg (60 mg Intramuscular Given 08/19/20 1241)    Initial Impression / Assessment and Plan / UC Course  I have reviewed the triage vital signs and the nursing notes.  Pertinent labs & imaging results that were  available during my care of the patient were reviewed by me and considered in my medical decision making (see chart for details).     This patient is a very pleasant 35 y.o. year old female presenting with facial rash. Afebrile, nontachy. No triggers identified. Ddx is impetigo vs rosacesa. Solumedrol administered today. Mupirocin sent. ED return precautions discussed. Patient verbalizes understanding and agreement.    Final Clinical Impressions(s) / UC Diagnoses   Final diagnoses:  Impetigo     Discharge Instructions      -Mupirocin cream 2x daily x7 days -Wash with gentle soap and water only -Come back and see [place] if symptoms getting worse instead of better     ED Prescriptions     Medication Sig Dispense Auth. Provider   mupirocin cream (BACTROBAN) 2 % Apply 1 application topically 2 (two) times daily for 7 days. 15 g Rhys Martini, PA-C      PDMP not reviewed this encounter.   Rhys Martini, PA-C 08/19/20 1307

## 2021-01-08 NOTE — L&D Delivery Note (Signed)
OB/GYN Faculty Practice Delivery Note  Gabriela Rivera is a 36 y.o. G5P4004 s/p vag del at [redacted]w[redacted]d. She was admitted for IOL due to GDMA2.   ROM: 0h 21m with clear/bloody fluid GBS Status: pos (rec'd PCN x 2 doses) Maximum Maternal Temperature: 98.4  Labor Progress: Gabriela Rivera was admitted in the afternoon of Nov 29th for IOL due to [facility]; she also was AMA and GBS+; she had a double dose of cytotec (50/25) followed by Pitocin then AROM prior to progressing rapidly to vag del.  Delivery Date/Time: November 29th, 2023 at 2158 Delivery: Called to room and patient was complete and pushing. Head delivered LOA. No nuchal cord present. Shoulder and body delivered in usual fashion. Infant with spontaneous cry, placed on mother's abdomen, dried and stimulated. Cord clamped x 2 after 1-minute delay, and cut by daughter of pt. Cord blood drawn. Placenta delivered spontaneously with gentle cord traction. Fundus firm with massage and Pitocin. Labia, perineum, vagina, and cervix inspected  and found to be intact.   Placenta: spont, intact; to L&D Complications: none Lacerations: none (sm perineal abrasion, not repaired) EBL: 267cc Analgesia: none  Postpartum Planning [x]  message to sent to schedule follow-up   Infant: girl  APGARs 8/9  3204g (7lb 1oz)  10/9, CNM  12/06/2021 10:23 PM

## 2021-06-12 ENCOUNTER — Ambulatory Visit (INDEPENDENT_AMBULATORY_CARE_PROVIDER_SITE_OTHER): Payer: Medicaid Other

## 2021-06-12 VITALS — BP 125/74 | HR 80 | Ht 62.0 in | Wt 178.4 lb

## 2021-06-12 DIAGNOSIS — Z3A12 12 weeks gestation of pregnancy: Secondary | ICD-10-CM

## 2021-06-12 DIAGNOSIS — O3680X0 Pregnancy with inconclusive fetal viability, not applicable or unspecified: Secondary | ICD-10-CM | POA: Diagnosis not present

## 2021-06-12 DIAGNOSIS — O0990 Supervision of high risk pregnancy, unspecified, unspecified trimester: Secondary | ICD-10-CM | POA: Insufficient documentation

## 2021-06-12 DIAGNOSIS — Z3480 Encounter for supervision of other normal pregnancy, unspecified trimester: Secondary | ICD-10-CM | POA: Diagnosis not present

## 2021-06-12 MED ORDER — GOJJI WEIGHT SCALE MISC: 1.0000 | 0 refills | Status: DC

## 2021-06-12 MED ORDER — BLOOD PRESSURE KIT DEVI: 1.0000 | 0 refills | Status: DC

## 2021-06-12 NOTE — Progress Notes (Signed)
New OB Intake  I connected with  Gabriela Rivera on 06/12/21 at 10:15 AM EDT by in person and verified that I am speaking with the correct person using two identifiers. Nurse is located at [facility] and pt is located at [place].  I discussed the limitations, risks, security and privacy concerns of performing an evaluation and management service by telephone and the availability of in person appointments. I also discussed with the patient that there may be a patient responsible charge related to this service. The patient expressed understanding and agreed to proceed.  I explained I am completing New OB Intake today. We discussed her EDD of 12/23/21 that is based on LMP of 03/18/21. Pt is G5/P4004. I reviewed her allergies, medications, Medical/Surgical/OB history, and appropriate screenings. I informed her of [facility] services. Based on history, this is a/an  pregnancy uncomplicated .   There are no problems to display for this patient.   Concerns addressed today  Delivery Plans:  Plans to deliver at [facility].   MyChart/Babyscripts MyChart access verified. I explained pt will have some visits in office and some virtually. Babyscripts instructions given and order placed. Patient verifies receipt of registration text/e-mail. Account successfully created and app downloaded.  Blood Pressure Cuff  Blood pressure cuff ordered for patient to pick-up from [employer]. Explained after first prenatal appt pt will check weekly and document in Babyscripts.  Weight scale: Patient  does not  have weight scale. Weight scale ordered for patient to pick up from [employer].   Anatomy US Explained first scheduled [place] will be around 19 weeks.Dating and viability scan performed today.  Labs Discussed Avelina Laine genetic screening with patient. Would like both Panorama and Horizon drawn at new OB visit.Also if interested in genetic testing, tell patient she will need AFP 15-21 weeks to complete genetic  testing .Routine prenatal labs needed.  Covid Vaccine Patient has covid vaccine.   Is patient a CenteringPregnancy candidate?  Declined Declined due to Group Setting Not a candidate due to Not interested Centering Patient" indicated on sticky note   Is patient interested in [place]?  No  "Interested in [employer] - Schedule next visit with CNM" on sticky note  Informed patient of [facility] website  and placed link in her AVS.   Social Determinants of Health Food Insecurity: Patient denies food insecurity. WIC Referral: Patient is interested in referral to [facility].  Transportation: Patient denies transportation needs. Childcare: Discussed no children allowed at ultrasound appointments. Offered childcare services; patient declines childcare services at this time.  Send link to Pregnancy Navigators   Placed OB Box on problem list and updated  First visit review I reviewed new OB appt with pt. I explained she will have a pelvic exam, ob bloodwork with genetic screening, and PAP smear. Explained pt will be seen by Nettie Elm at first visit; encounter routed to appropriate provider. Explained that patient will be seen by pregnancy navigator following visit with provider. [facility] information placed in AVS.   Hamilton Capri, RN 06/12/2021  10:06 AM

## 2021-06-21 ENCOUNTER — Encounter: Payer: Self-pay | Admitting: Obstetrics and Gynecology

## 2021-06-21 ENCOUNTER — Ambulatory Visit (INDEPENDENT_AMBULATORY_CARE_PROVIDER_SITE_OTHER): Payer: Medicaid Other | Admitting: Obstetrics and Gynecology

## 2021-06-21 ENCOUNTER — Other Ambulatory Visit (HOSPITAL_COMMUNITY)
Admission: RE | Admit: 2021-06-21 | Discharge: 2021-06-21 | Disposition: A | Discharge status: Home / Self Care | Payer: Medicaid Other | Source: Ambulatory Visit | Attending: Obstetrics and Gynecology | Admitting: Obstetrics and Gynecology

## 2021-06-21 DIAGNOSIS — Z3A13 13 weeks gestation of pregnancy: Secondary | ICD-10-CM

## 2021-06-21 DIAGNOSIS — Z3482 Encounter for supervision of other normal pregnancy, second trimester: Secondary | ICD-10-CM

## 2021-06-21 DIAGNOSIS — Z3480 Encounter for supervision of other normal pregnancy, unspecified trimester: Secondary | ICD-10-CM

## 2021-06-21 DIAGNOSIS — R87619 Unspecified abnormal cytological findings in specimens from cervix uteri: Secondary | ICD-10-CM | POA: Insufficient documentation

## 2021-06-21 NOTE — Patient Instructions (Signed)
Primer trimestre de embarazo First Trimester of Pregnancy  El primer trimestre de embarazo comienza el primer da de su ltimo periodo menstrual hasta el final de la semana 12. Tambin se dice que va desde el mes 1 hasta el mes 3 de embarazo. Durante el primer trimestre, ocurren cambios en el cuerpo Su organismo atraviesa por muchos cambios durante el embarazo. En general, los cambios vuelven a la normalidad despus del nacimiento del beb. Cambios fsicos Usted puede aumentar o bajar de peso. Las mamas pueden agrandarse y doler. La zona que rodea los pezones puede oscurecerse. Pueden aparecer zonas oscuras o manchas en el rostro. Tal vez haya cambios en el cabello. Cambios en la salud Es posible que se sienta como si fuera a vomitar (nuseas) y quizs vomite. Es posible que tenga acidez estomacal. Es posible que tenga dolores de cabeza. Es posible que tenga dificultades para defecar (estreimiento). Las encas pueden sangrarle. Otros cambios Es posible que se canse con facilidad. Es posible que haga pis (orine) con mayor frecuencia. Los perodos menstruales se interrumpirn. Es posible que no tenga hambre. Es posible que quiera comer ciertos tipos de alimentos. Puede tener cambios en sus emociones de un da para otro. Es posible que tenga ms sueos. Siga estas instrucciones en su casa: Medicamentos Use los medicamentos de venta libre y los recetados solamente como se lo haya indicado el mdico. Algunos medicamentos no son seguros durante el embarazo. Tome vitaminas prenatales que contengan por lo menos 600 microgramos (mcg) de cido flico. Comida y bebida Consuma comidas saludables que incluyan lo siguiente: Frutas y verduras frescas. Cereales integrales. Buenas fuentes de protenas, como carne, huevos y tofu. Productos lcteos con bajo contenido de grasa. Evite la carne cruda y el jugo, la leche y el queso sin pasteurizar. Si se siente como si fuera a vomitar: Ingiera 4 o 5  comidas pequeas por da en lugar de 3 abundantes. Intente comer algunas galletitas saladas. Beba lquidos entre las comidas, en lugar de hacerlo durante estas. Es posible que deba tomar medidas para prevenir o tratar los problemas para defecar: Beber suficiente lquido para mantener el pis (orina) de color amarillo plido. Come alimentos ricos en fibra. Entre ellos, frijoles, cereales integrales y frutas y verduras frescas. Limitar los alimentos con alto contenido de grasa y azcar. Estos incluyen alimentos fritos o dulces. Actividad Haga ejercicios solamente como se lo haya indicado el mdico. La mayora de las personas pueden realizar su rutina de ejercicios habitual durante el embarazo. Deje de hacer ejercicios si tiene clicos o dolor en la parte baja del vientre (abdomen) o en la cintura. No haga ejercicio si hace demasiado calor, hay demasiada humedad o se encuentra en un lugar de mucha altura (altitud elevada). Evite levantar pesos excesivos. Si lo desea, puede continuar teniendo relaciones sexuales, a menos que el mdico le indique lo contrario. Alivio del dolor y del malestar Use un sostn que le brinde buen soporte si le duelen las mamas. Descanse con las piernas levantadas (elevadas) si tiene calambres en las piernas o dolor en la parte baja de la espalda. Si tiene las venas de las piernas abultadas (venas varicosas): Use medias de compresin segn las indicaciones de su mdico. Levante los pies durante 15 minutos, 3 o 4 veces por da. Limite la sal en sus alimentos. Seguridad Use el cinturn de seguridad en todo momento mientras vaya en auto. Hable con el mdico si alguien le est haciendo dao o gritando mucho. Hable con el mdico si se siente triste   o tiene pensamientos acerca de hacerse dao a usted misma. Estilo de vida No se d baos de inmersin en agua caliente, baos turcos ni saunas. No se haga duchas vaginales. No use tampones ni toallas higinicas perfumadas. No  consuma medicamentos a base de hierbas, drogas ilegales, ni medicamentos que el mdico no haya autorizado. No beba alcohol. No fume ni consuma ningn producto que contenga nicotina o tabaco. Si necesita ayuda para dejar de fumar, consulte al mdico. Evite el contacto con las bandejas sanitarias de los gatos y la tierra que estos animales usan. Estos contienen grmenes que pueden daar al beb y causar la prdida del beb ya sea aborto espontneo o muerte fetal. Instrucciones generales Cumpla con todas las visitas de seguimiento. Esto es importante. Pida ayuda si necesita asesoramiento o asistencia con la alimentacin. El mdico puede aconsejarla o indicarle dnde recurrir para recibir ayuda. Visite al dentista. En su casa, lvese los dientes con un cepillo dental suave. Psese el hilo dental suavemente. Escriba sus preguntas. Llvelas cuando concurra a las visitas prenatales. Dnde buscar ms informacin American Pregnancy Association (Asociacin Americana del Embarazo): americanpregnancy.org American College of Obstetricians and Gynecologists (Colegio Estadounidense de Obstetras y Gineclogos): www.acog.org Office on Women's Health (Oficina para la Salud de la Mujer): womenshealth.gov/pregnancy Comunquese con un mdico si: Tiene mareos. Tiene fiebre. Tiene clicos leves o siente presin en la parte baja del vientre. Sufre un dolor persistente en el abdomen. Sigue sintiendo como si fuera a vomitar, vomita o hace deposiciones acuosas (diarrea) durante 24 horas o ms. Advierte lquido con mal olor que proviene de la vagina. Siente dolor al orinar. Se expone a una enfermedad que se transmite de una persona a otra, como varicela, sarampin, virus de Zika, VIH o hepatitis. Solicite ayuda de inmediato si: Tiene sangrado o pequeas prdidas vaginales. Tiene clicos o dolor muy intensos en el vientre. Le falta el aire o le duele el pecho. Sufre cualquier tipo de lesin, por ejemplo, debido a una  cada o un accidente automovilstico. Tiene dolor, hinchazn o enrojecimiento nuevos en un brazo o una pierna o se produce un aumento de alguno de estos sntomas. Resumen El primer trimestre del embarazo comienza el primer da de su ltimo periodo menstrual hasta el final de la semana 12 (meses 1 al 3). Ingiera 4 o 5 comidas pequeas por da en lugar de 3 abundantes. No fume ni consuma ningn producto que contenga nicotina o tabaco. Si necesita ayuda para dejar de fumar, consulte al mdico. Cumpla con todas las visitas de seguimiento. Esta informacin no tiene como fin reemplazar el consejo del mdico. Asegrese de hacerle al mdico cualquier pregunta que tenga. Document Revised: 07/03/2019 Document Reviewed: 07/03/2019 Elsevier Patient Education  2023 Elsevier Inc.  

## 2021-06-21 NOTE — Progress Notes (Signed)
Subjective:  Gabriela Rivera is a 36 y.o. [id number] at [redacted]w[redacted]d being seen today for her first OB visit. EDD by LMP and confirmed by first trimester U/S. Denies any chronic medical problems or medications. H/O TSVD x 4 without problems. H/O abnormal pap smear with colpo, ? Results. .  She is currently monitored for the following issues for this low-risk pregnancy and has Supervision of other normal pregnancy, antepartum and Abnormal Pap smear of cervix on their problem list.  Patient reports no complaints.  Contractions: Not present. Vag. Bleeding: None.   . Denies leaking of fluid.   The following portions of the patient's history were reviewed and updated as appropriate: allergies, current medications, past family history, past medical history, past social history, past surgical history and problem list. Problem list updated.  Objective:   Vitals:   06/21/21 1005  BP: 120/78  Pulse: 80  Weight: 179 lb (81.2 kg)    Fetal Status: Fetal Heart Rate (bpm): 148         General:  Alert, oriented and cooperative. Patient is in no acute distress.  Skin: Skin is warm and dry. No rash noted.   Cardiovascular: Normal heart rate noted  Respiratory: Normal respiratory effort, no problems with respiration noted  Abdomen: Soft, gravid, appropriate for gestational age. Pain/Pressure: Absent     Pelvic:  Cervical exam deferred        Extremities: Normal range of motion.  Edema: None  Mental Status: Normal mood and affect. Normal behavior. Normal judgment and thought content.   Urinalysis:      Assessment and Plan:  Pregnancy: G5P4004 at [redacted]w[redacted]d  1. Supervision of other normal pregnancy, antepartum Prenatal care and labs reviewed with pt. Genetic testing reviewed - CBC/D/Plt+RPR+Rh+ABO+RubIgG... - Culture, OB Urine - Genetic Screening - HgB A1c - [place] MFM OB DETAIL +14 WK; Future - Cervicovaginal ancillary only( )  2. Abnormal cervical Papanicolaou smear, unspecified abnormal pap  finding Will have pt sign for medical records  Preterm labor symptoms and general obstetric precautions including but not limited to vaginal bleeding, contractions, leaking of fluid and fetal movement were reviewed in detail with the patient. Please refer to After Visit Summary for other counseling recommendations.  Return in about 4 weeks (around 07/19/2021) for OB visit, face to face, any provider.   Hermina Staggers, MD

## 2021-06-22 LAB — CERVICOVAGINAL ANCILLARY ONLY
Chlamydia: NEGATIVE
Comment: NEGATIVE
Comment: NEGATIVE
Comment: NORMAL
Neisseria Gonorrhea: NEGATIVE
Trichomonas: NEGATIVE

## 2021-06-23 ENCOUNTER — Encounter: Payer: Self-pay | Admitting: Obstetrics and Gynecology

## 2021-06-23 DIAGNOSIS — Z2839 Other underimmunization status: Secondary | ICD-10-CM | POA: Insufficient documentation

## 2021-06-23 LAB — CBC/D/PLT+RPR+RH+ABO+RUBIGG...
Antibody Screen: NEGATIVE
Basophils Absolute: 0 10*3/uL (ref 0.0–0.2)
Basos: 0 %
EOS (ABSOLUTE): 0.1 10*3/uL (ref 0.0–0.4)
Eos: 1 %
HCV Ab: NONREACTIVE
HIV Screen 4th Generation wRfx: NONREACTIVE
Hematocrit: 32.3 % — ABNORMAL LOW (ref 34.0–46.6)
Hemoglobin: 10.9 g/dL — ABNORMAL LOW (ref 11.1–15.9)
Hepatitis B Surface Ag: NEGATIVE
Immature Grans (Abs): 0 10*3/uL (ref 0.0–0.1)
Immature Granulocytes: 1 %
Lymphocytes Absolute: 1.7 10*3/uL (ref 0.7–3.1)
Lymphs: 20 %
MCH: 27.9 pg (ref 26.6–33.0)
MCHC: 33.7 g/dL (ref 31.5–35.7)
MCV: 83 fL (ref 79–97)
Monocytes Absolute: 0.3 10*3/uL (ref 0.1–0.9)
Monocytes: 4 %
Neutrophils Absolute: 6.6 10*3/uL (ref 1.4–7.0)
Neutrophils: 74 %
Platelets: 261 10*3/uL (ref 150–450)
RBC: 3.91 x10E6/uL (ref 3.77–5.28)
RDW: 14.4 % (ref 11.7–15.4)
RPR Ser Ql: NONREACTIVE
Rh Factor: POSITIVE
Rubella Antibodies, IGG: <0.9 index — ABNORMAL LOW (ref >0.99)
WBC: 8.7 10*3/uL (ref 3.4–10.8)

## 2021-06-23 LAB — URINE CULTURE, OB REFLEX: Organism ID, Bacteria: NO GROWTH

## 2021-06-23 LAB — HEMOGLOBIN A1C
Est. average glucose Bld gHb Est-mCnc: 123 mg/dL
Hgb A1c MFr Bld: 5.9 % — ABNORMAL HIGH (ref 4.8–5.6)

## 2021-06-23 LAB — CULTURE, OB URINE

## 2021-06-23 LAB — HCV INTERPRETATION

## 2021-06-26 NOTE — Progress Notes (Signed)
TC using Spanish interpreter. No answer. HIPPA compliant VM left indicating for pt to call the office. Pt does not have MyChart.

## 2021-06-27 ENCOUNTER — Encounter: Payer: Self-pay | Admitting: Obstetrics and Gynecology

## 2021-06-27 ENCOUNTER — Other Ambulatory Visit: Payer: Self-pay

## 2021-06-27 DIAGNOSIS — O99012 Anemia complicating pregnancy, second trimester: Secondary | ICD-10-CM

## 2021-06-27 MED ORDER — FERROUS SULFATE 325 (65 FE) MG PO TABS: 325.0000 mg | ORAL_TABLET | ORAL | 3 refills | Status: DC

## 2021-07-03 ENCOUNTER — Encounter: Payer: Self-pay | Admitting: Obstetrics and Gynecology

## 2021-07-03 ENCOUNTER — Other Ambulatory Visit: Payer: Self-pay | Admitting: *Deleted

## 2021-07-03 DIAGNOSIS — Z3480 Encounter for supervision of other normal pregnancy, unspecified trimester: Secondary | ICD-10-CM

## 2021-07-03 DIAGNOSIS — R898 Other abnormal findings in specimens from other organs, systems and tissues: Secondary | ICD-10-CM | POA: Insufficient documentation

## 2021-07-17 ENCOUNTER — Ambulatory Visit (INDEPENDENT_AMBULATORY_CARE_PROVIDER_SITE_OTHER): Payer: Medicaid Other | Admitting: Advanced Practice Midwife

## 2021-07-17 ENCOUNTER — Encounter: Payer: Self-pay | Admitting: Advanced Practice Midwife

## 2021-07-17 VITALS — BP 125/77 | HR 71 | Wt 181.9 lb

## 2021-07-17 DIAGNOSIS — Z3480 Encounter for supervision of other normal pregnancy, unspecified trimester: Secondary | ICD-10-CM

## 2021-07-17 DIAGNOSIS — Z3A17 17 weeks gestation of pregnancy: Secondary | ICD-10-CM

## 2021-07-17 DIAGNOSIS — O09299 Supervision of pregnancy with other poor reproductive or obstetric history, unspecified trimester: Secondary | ICD-10-CM

## 2021-07-17 DIAGNOSIS — Z3A19 19 weeks gestation of pregnancy: Secondary | ICD-10-CM

## 2021-07-17 DIAGNOSIS — O09522 Supervision of elderly multigravida, second trimester: Secondary | ICD-10-CM

## 2021-07-17 DIAGNOSIS — O99012 Anemia complicating pregnancy, second trimester: Secondary | ICD-10-CM

## 2021-07-17 NOTE — Progress Notes (Signed)
   PRENATAL VISIT NOTE  Subjective:  Gabriela Rivera is a 36 y.o. G5P4004 at [redacted]w[redacted]d being seen today for ongoing prenatal care.  She is currently monitored for the following issues for this low-risk pregnancy and has Supervision of other normal pregnancy, antepartum; Abnormal Pap smear of cervix; Rubella non-immune status, antepartum; and Abnormal genetic test on their problem list.  Patient reports no complaints.  Contractions: Not present. Vag. Bleeding: None.  Movement: Present. Denies leaking of fluid.   The following portions of the patient's history were reviewed and updated as appropriate: allergies, current medications, past family history, past medical history, past social history, past surgical history and problem list.   Objective:   Vitals:   07/17/21 1118  BP: 125/77  Pulse: 71  Weight: 181 lb 14.4 oz (82.5 kg)    Fetal Status: Fetal Heart Rate (bpm): 144 Fundal Height: 17 cm Movement: Present     General:  Alert, oriented and cooperative. Patient is in no acute distress.  Skin: Skin is warm and dry. No rash noted.   Cardiovascular: Normal heart rate noted  Respiratory: Normal respiratory effort, no problems with respiration noted  Abdomen: Soft, gravid, appropriate for gestational age.  Pain/Pressure: Absent     Pelvic: Cervical exam deferred        Extremities: Normal range of motion.  Edema: None  Mental Status: Normal mood and affect. Normal behavior. Normal judgment and thought content.   Assessment and Plan:  Pregnancy: G5P4004 at [redacted]w[redacted]d 1. Supervision of other normal pregnancy, antepartum --Anticipatory guidance about next visits/weeks of pregnancy given.   2. Anemia affecting pregnancy in second trimester --taking iron every other day, reviewed iron rich foods  3. [redacted] weeks gestation of pregnancy  - AFP, Serum, Open Spina Bifida   4. Multigravida of advanced maternal age in second trimester  5. History of macrosomia in infant in prior pregnancy,  currently pregnant --Prev infant 10lbs at delivery, no complications with SVD   Preterm labor symptoms and general obstetric precautions including but not limited to vaginal bleeding, contractions, leaking of fluid and fetal movement were reviewed in detail with the patient. Please refer to After Visit Summary for other counseling recommendations.   Return in about 4 weeks (around 08/14/2021) for Any provider, notify pt about her [place] scheduled 7/24.  Future Appointments  Date Time Provider Department Center  07/31/2021  1:15 PM [facility] NURSE [facility]  07/31/2021  1:30 PM [facility] US3 [facility]  07/31/2021  2:30 PM [facility] GENETIC COUNSELING RM [facility]    Sharen Counter, CNM

## 2021-07-17 NOTE — Progress Notes (Signed)
Pt presents for ROB visit. She has no concerns at this time.  

## 2021-07-19 LAB — AFP, SERUM, OPEN SPINA BIFIDA
AFP MoM: 0.97
AFP Value: 35.1 ng/mL
Gest. Age on Collection Date: 17.3 weeks
Maternal Age At EDD: 38.4 yr
OSBR Risk 1 IN: 10000
Test Results:: NEGATIVE
Weight: 181 [lb_av]

## 2021-07-31 ENCOUNTER — Ambulatory Visit: Payer: Self-pay

## 2021-07-31 ENCOUNTER — Other Ambulatory Visit: Payer: Self-pay | Admitting: *Deleted

## 2021-07-31 ENCOUNTER — Ambulatory Visit: Payer: Medicaid Other | Admitting: *Deleted

## 2021-07-31 ENCOUNTER — Ambulatory Visit (HOSPITAL_BASED_OUTPATIENT_CLINIC_OR_DEPARTMENT_OTHER): Payer: Medicaid Other

## 2021-07-31 ENCOUNTER — Encounter: Payer: Self-pay | Admitting: *Deleted

## 2021-07-31 ENCOUNTER — Ambulatory Visit: Payer: Medicaid Other | Attending: Obstetrics and Gynecology

## 2021-07-31 VITALS — BP 117/59 | HR 80

## 2021-07-31 DIAGNOSIS — Z3A19 19 weeks gestation of pregnancy: Secondary | ICD-10-CM | POA: Insufficient documentation

## 2021-07-31 DIAGNOSIS — R898 Other abnormal findings in specimens from other organs, systems and tissues: Secondary | ICD-10-CM | POA: Insufficient documentation

## 2021-07-31 DIAGNOSIS — O09522 Supervision of elderly multigravida, second trimester: Secondary | ICD-10-CM | POA: Insufficient documentation

## 2021-07-31 DIAGNOSIS — Z3480 Encounter for supervision of other normal pregnancy, unspecified trimester: Secondary | ICD-10-CM

## 2021-07-31 DIAGNOSIS — Z363 Encounter for antenatal screening for malformations: Secondary | ICD-10-CM | POA: Diagnosis present

## 2021-07-31 DIAGNOSIS — Z6832 Body mass index (BMI) 32.0-32.9, adult: Secondary | ICD-10-CM

## 2021-07-31 DIAGNOSIS — O99212 Obesity complicating pregnancy, second trimester: Secondary | ICD-10-CM | POA: Diagnosis not present

## 2021-07-31 DIAGNOSIS — Z148 Genetic carrier of other disease: Secondary | ICD-10-CM | POA: Diagnosis not present

## 2021-07-31 DIAGNOSIS — O09529 Supervision of elderly multigravida, unspecified trimester: Secondary | ICD-10-CM

## 2021-07-31 NOTE — Progress Notes (Signed)
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   EDC: 12/23/2021 LMP: 03/18/2021 Referring Provider:  Chancy Milroy, MD  EGA: 51w2dGenetic Counselor: AStaci Righter MS, CMerr[place] OB Hx:: [id number] of Appointment: 07/31/2021  Accompanied by: UKristine LineaFace to Face Time: 30 Minutes   Previous Testing Completed: AVendettapreviously completed cell-free DNA screening (cfDNA) in this pregnancy. The result is low risk. This screening significantly reduces the risk that the current pregnancy has Down syndrome, Trisomy 139 Trisomy 178 and common sex chromosome conditions, however, the risk is not zero given the limitations of cfDNA. Additionally, there are many genetic conditions that cannot be detected by cfDNA.  ARaymepreviously completed carrier screening. She screened to be a carrier for Spinal Muscular Atrophy (SMA). She screened to not be a carrier for Cystic Fibrosis (CF), alpha thalassemia, and beta hemoglobinopathies. A negative result on carrier screening reduces the likelihood of being a carrier, however, does not entirely rule out the possibility. ADarrellepreviously completed a maternal serum AFP screen in this pregnancy. The result is screen negative. A negative result reduces the risk that the current pregnancy has an open neural tube defect. Closed neural tube defects and some open defects may not be detected by this screen.    Medical History:  This is Gabriela Rivera's 5th pregnancy. She has 4 living, healthy children. Reports she takes prenatal vitamins and iron. ATamzinreports that one day in her pregnancy she felt very hot. She reports she took tylenol and pressed cold compresses to her body. She did not take her temperature with a thermometer. Denies personal history of diabetes, high blood  pressure, thyroid conditions, and seizures. Denies bleeding and infections in this pregnancy. Denies using tobacco, alcohol, or street drugs in this pregnancy.   Family History: A pedigree was created and scanned into Epic under the Media tab. Maternal ethnicity reported as Hispanic and paternal ethnicity reported as Hispanic. Denies [name] ancestry. Family history not remarkable for consanguinity, individuals with birth defects, intellectual disability, autism spectrum disorder, multiple spontaneous abortions, still births, or unexplained neonatal death.     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 SMA is an autosomal recessive disease caused by loss of function mutations in the SMN1 gene. AShammarais a carrier for SMA, meaning she has one functional copy of SMN1 and her other copy of SMN1 has lost its function either due to a mutation in the gene or a deletion of the gene. If Gabriela Rivera's reproductive partner is found to also be a carrier for SMA, there would be a 25% risk for their offspring together to be affected (have zero functional copies of the SMN1 gene). Individuals with SMA have progressive, proximal, and symmetrical muscle weakness and atrophy due to degeneration of motor neurons of the spine and brainstem. Symptoms can first appear at any time between before birth to adulthood. There are five types of SMA that are historically classified based on clinical presentation, onset of symptoms, and the maximum skill level of motor milestones achieved, however, there is a lot of overlap between types. There are several treatments available for individuals affected with SMA and studies show that treatment is most effective when it is started in the first few months of life. Given that Gabriela Rivera a carrier of SMA,  genetic counseling recommended screening Gabriela Rivera for SMA. Gabriela Rivera verbalized understanding of the information discussed and stated that she is going to talk to Gabriela Rivera  about carrier screening for SMA. We offered to have a saliva kit sent to the couple's home so that Gabriela Rivera does not have to come to the [facility] for a blood draw. Gabriela Rivera was given the contact information for the [facility]. She stated that she will call us if Gabriela Rivera would like to complete carrier screening for SMA so that a salvia kit can be shipped to their home as soon as possible. We also discussed that SMA is included in Gabriela Rivera's newborn screening program.    Patient Plan:  Proceed with: Gabriela Rivera verbalized understanding of the information discussed and stated that she is going to talk to Gabriela Rivera about carrier screening for SMA. We offered to have a saliva kit sent to the couple's home so that Gabriela Rivera does not have to come to the [facility] for a blood draw. Gabriela Rivera was given the contact information for the [facility]. She stated that she will call us if Gabriela Rivera would like to complete carrier screening for SMA so that a salvia kit can be shipped to their home as soon as possible.   All questions were answered.    Thank you for sharing in the care of Gabriela Rivera with [place].  Please do not hesitate to contact us if you have any questions.  Staci Righter, MS, Gabriela Rivera

## 2021-08-07 NOTE — Progress Notes (Unsigned)
   PRENATAL VISIT NOTE  Subjective:  Gabriela Rivera is a 36 y.o. G5P4004 at [redacted]w[redacted]d being seen today for ongoing prenatal care.  She is currently monitored for the following issues for this {Blank single:19197::"high-risk","low-risk"} pregnancy and has Supervision of other normal pregnancy, antepartum; Abnormal Pap smear of cervix; Rubella non-immune status, antepartum; Abnormal genetic test; and Carrier of spinal muscular atrophy on their problem list.  Patient reports {sx:14538}.   .  .   . Denies leaking of fluid.   The following portions of the patient's history were reviewed and updated as appropriate: allergies, current medications, past family history, past medical history, past social history, past surgical history and problem list.   Objective:  There were no vitals filed for this visit.  Fetal Status:           General:  Alert, oriented and cooperative. Patient is in no acute distress.  Skin: Skin is warm and dry. No rash noted.   Cardiovascular: Normal heart rate noted  Respiratory: Normal respiratory effort, no problems with respiration noted  Abdomen: Soft, gravid, appropriate for gestational age.        Pelvic: {Blank single:19197::"Cervical exam performed in the presence of a chaperone","Cervical exam deferred"}        Extremities: Normal range of motion.     Mental Status: Normal mood and affect. Normal behavior. Normal judgment and thought content.   Assessment and Plan:  Pregnancy: G5P4004 at [redacted]w[redacted]d 1. Rubella non-immune status, antepartum ***  2. Abnormal genetic test ***  3. Supervision of other normal pregnancy, antepartum ***  4. Carrier of spinal muscular atrophy ***  {Blank single:19197::"Term","Preterm"} labor symptoms and general obstetric precautions including but not limited to vaginal bleeding, contractions, leaking of fluid and fetal movement were reviewed in detail with the patient. Please refer to After Visit Summary for other counseling  recommendations.   No follow-ups on file.  Future Appointments  Date Time Provider Department Center  08/08/2021  9:35 AM Myrtie Hawk, DO [facility] None  10/23/2021 10:30 AM [facility] NURSE [facility]  10/23/2021 10:45 AM [facility] US5 [facility]    Lahoma Crocker Mercado-Ortiz, DO

## 2021-08-08 ENCOUNTER — Encounter: Payer: Medicaid Other | Admitting: Family Medicine

## 2021-08-08 DIAGNOSIS — Z3480 Encounter for supervision of other normal pregnancy, unspecified trimester: Secondary | ICD-10-CM

## 2021-08-08 DIAGNOSIS — R898 Other abnormal findings in specimens from other organs, systems and tissues: Secondary | ICD-10-CM

## 2021-08-08 DIAGNOSIS — O09899 Supervision of other high risk pregnancies, unspecified trimester: Secondary | ICD-10-CM

## 2021-08-08 DIAGNOSIS — Z148 Genetic carrier of other disease: Secondary | ICD-10-CM

## 2021-08-28 ENCOUNTER — Ambulatory Visit (INDEPENDENT_AMBULATORY_CARE_PROVIDER_SITE_OTHER): Payer: Medicaid Other | Admitting: Advanced Practice Midwife

## 2021-08-28 VITALS — BP 97/62 | HR 77 | Wt 181.0 lb

## 2021-08-28 DIAGNOSIS — Z3480 Encounter for supervision of other normal pregnancy, unspecified trimester: Secondary | ICD-10-CM

## 2021-08-28 DIAGNOSIS — O09522 Supervision of elderly multigravida, second trimester: Secondary | ICD-10-CM

## 2021-08-28 DIAGNOSIS — Z3A23 23 weeks gestation of pregnancy: Secondary | ICD-10-CM

## 2021-08-28 NOTE — Progress Notes (Signed)
   PRENATAL VISIT NOTE  Subjective:  Gabriela Rivera is a 36 y.o. G5P4004 at [redacted]w[redacted]d being seen today for ongoing prenatal care.  She is currently monitored for the following issues for this low-risk pregnancy and has Supervision of other normal pregnancy, antepartum; Abnormal Pap smear of cervix; Rubella non-immune status, antepartum; Abnormal genetic test; and Carrier of spinal muscular atrophy on their problem list.  Patient reports no complaints.  Contractions: Not present. Vag. Bleeding: None.  Movement: Present. Denies leaking of fluid.   The following portions of the patient's history were reviewed and updated as appropriate: allergies, current medications, past family history, past medical history, past social history, past surgical history and problem list.   Objective:   Vitals:   08/28/21 1338  BP: 97/62  Pulse: 77  Weight: 181 lb (82.1 kg)    Fetal Status: Fetal Heart Rate (bpm): 154   Movement: Present     General:  Alert, oriented and cooperative. Patient is in no acute distress.  Skin: Skin is warm and dry. No rash noted.   Cardiovascular: Normal heart rate noted  Respiratory: Normal respiratory effort, no problems with respiration noted  Abdomen: Soft, gravid, appropriate for gestational age.  Pain/Pressure: Absent     Pelvic: Cervical exam deferred        Extremities: Normal range of motion.  Edema: None  Mental Status: Normal mood and affect. Normal behavior. Normal judgment and thought content.   Assessment and Plan:  Pregnancy: G5P4004 at [redacted]w[redacted]d 1. Supervision of other normal pregnancy, antepartum --Anticipatory guidance about next visits/weeks of pregnancy given.  --Questions answered about safe exercise in pregnancy, pt doing aerobics, Ok to continue but watch safety with falling and listen to your body and rest as needed.   2. Multigravida of advanced maternal age in second trimester   3. [redacted] weeks gestation of pregnancy   Preterm labor symptoms and  general obstetric precautions including but not limited to vaginal bleeding, contractions, leaking of fluid and fetal movement were reviewed in detail with the patient. Please refer to After Visit Summary for other counseling recommendations.   Return in about 4 weeks (around 09/25/2021) for GTT at next visit, Any provider, LOB.  Future Appointments  Date Time Provider Department Center  09/25/2021  9:15 AM [facility] LAB [facility] None  09/25/2021 11:15 AM Leftwich-Kirby, Wilmer Floor, CNM [facility] None  10/23/2021 10:30 AM [facility] NURSE [facility]  10/23/2021 10:45 AM [facility] US5 [facility]    Sharen Counter, CNM

## 2021-09-25 ENCOUNTER — Other Ambulatory Visit: Payer: Medicaid Other

## 2021-09-25 ENCOUNTER — Ambulatory Visit (INDEPENDENT_AMBULATORY_CARE_PROVIDER_SITE_OTHER): Payer: Medicaid Other | Admitting: Advanced Practice Midwife

## 2021-09-25 VITALS — BP 106/63 | HR 75 | Wt 187.0 lb

## 2021-09-25 DIAGNOSIS — O09522 Supervision of elderly multigravida, second trimester: Secondary | ICD-10-CM

## 2021-09-25 DIAGNOSIS — Z23 Encounter for immunization: Secondary | ICD-10-CM

## 2021-09-25 DIAGNOSIS — O99012 Anemia complicating pregnancy, second trimester: Secondary | ICD-10-CM

## 2021-09-25 DIAGNOSIS — Z3A27 27 weeks gestation of pregnancy: Secondary | ICD-10-CM

## 2021-09-25 DIAGNOSIS — Z3482 Encounter for supervision of other normal pregnancy, second trimester: Secondary | ICD-10-CM

## 2021-09-25 DIAGNOSIS — Z3480 Encounter for supervision of other normal pregnancy, unspecified trimester: Secondary | ICD-10-CM

## 2021-09-25 NOTE — Progress Notes (Signed)
   PRENATAL VISIT NOTE  Subjective:  Gabriela Rivera is a 36 y.o. G5P4004 at [redacted]w[redacted]d being seen today for ongoing prenatal care.  She is currently monitored for the following issues for this low-risk pregnancy and has Supervision of other normal pregnancy, antepartum; Abnormal Pap smear of cervix; Rubella non-immune status, antepartum; Abnormal genetic test; and Carrier of spinal muscular atrophy on their problem list.  Patient reports no complaints.  Contractions: Not present. Vag. Bleeding: None.  Movement: Present. Denies leaking of fluid.   The following portions of the patient's history were reviewed and updated as appropriate: allergies, current medications, past family history, past medical history, past social history, past surgical history and problem list.   Objective:   Vitals:   09/25/21 0930  BP: 106/63  Pulse: 75  Weight: 187 lb (84.8 kg)    Fetal Status: Fetal Heart Rate (bpm): 138   Movement: Present     General:  Alert, oriented and cooperative. Patient is in no acute distress.  Skin: Skin is warm and dry. No rash noted.   Cardiovascular: Normal heart rate noted  Respiratory: Normal respiratory effort, no problems with respiration noted  Abdomen: Soft, gravid, appropriate for gestational age.  Pain/Pressure: Absent     Pelvic: Cervical exam deferred        Extremities: Normal range of motion.  Edema: Trace  Mental Status: Normal mood and affect. Normal behavior. Normal judgment and thought content.   Assessment and Plan:  Pregnancy: [id number] at [redacted]w[redacted]d 1. Supervision of other normal pregnancy, antepartum --Anticipatory guidance about next visits/weeks of pregnancy given.   - Glucose Tolerance, 2 Hours w/1 Hour - RPR - CBC - HIV antibody (with reflex) - Tdap vaccine greater than or equal to 7yo IM - Flu Vaccine QUAD 36+ mos IM (Fluarix, Quad PF)  2. Multigravida of advanced maternal age in second trimester   3. Anemia affecting pregnancy in second  trimester --Hgb 10.9 in early pregnancy, recheck today  4. [redacted] weeks gestation of pregnancy   Preterm labor symptoms and general obstetric precautions including but not limited to vaginal bleeding, contractions, leaking of fluid and fetal movement were reviewed in detail with the patient. Please refer to After Visit Summary for other counseling recommendations.   Return in about 2 weeks (around 10/09/2021) for LOB, Any provider.  Future Appointments  Date Time Provider [place]  10/10/2021  9:55 AM Caren Macadam, MD [place] None  10/23/2021 10:30 AM [facility] NURSE [facility]  10/23/2021 10:45 AM [facility] US5 [facility]  10/24/2021 10:35 AM Johnston Ebbs, NP [place] None  11/07/2021 10:35 AM Johnston Ebbs, NP [place] None  11/21/2021 10:35 AM Griffin Basil, MD [place] None  11/28/2021 10:55 AM Leftwich-Kirby, Kathie Dike, CNM [facility] None  12/05/2021 10:35 AM Gavin Pound, CNM [facility] None    Fatima Blank, CNM

## 2021-09-25 NOTE — Progress Notes (Addendum)
ROB/GTT.  TDAp and FLU vaccines given in LD, tolerated well.  Reports no concerns today.

## 2021-09-26 ENCOUNTER — Encounter (HOSPITAL_BASED_OUTPATIENT_CLINIC_OR_DEPARTMENT_OTHER): Payer: Self-pay | Admitting: Advanced Practice Midwife

## 2021-09-26 DIAGNOSIS — O24419 Gestational diabetes mellitus in pregnancy, unspecified control: Secondary | ICD-10-CM | POA: Insufficient documentation

## 2021-09-26 LAB — HIV ANTIBODY (ROUTINE TESTING W REFLEX): HIV Screen 4th Generation wRfx: NONREACTIVE

## 2021-09-26 LAB — CBC
Hematocrit: 31.4 % — ABNORMAL LOW (ref 34.0–46.6)
Hemoglobin: 10.7 g/dL — ABNORMAL LOW (ref 11.1–15.9)
MCH: 29.5 pg (ref 26.6–33.0)
MCHC: 34.1 g/dL (ref 31.5–35.7)
MCV: 87 fL (ref 79–97)
Platelets: 216 10*3/uL (ref 150–450)
RBC: 3.63 x10E6/uL — ABNORMAL LOW (ref 3.77–5.28)
RDW: 13.5 % (ref 11.7–15.4)
WBC: 8.8 10*3/uL (ref 3.4–10.8)

## 2021-09-26 LAB — GLUCOSE TOLERANCE, 2 HOURS W/ 1HR
Glucose, 1 hour: 211 mg/dL — ABNORMAL HIGH (ref 70–179)
Glucose, 2 hour: 168 mg/dL — ABNORMAL HIGH (ref 70–152)
Glucose, Fasting: 92 mg/dL — ABNORMAL HIGH (ref 70–91)

## 2021-09-26 LAB — RPR: RPR Ser Ql: NONREACTIVE

## 2021-10-10 ENCOUNTER — Ambulatory Visit (INDEPENDENT_AMBULATORY_CARE_PROVIDER_SITE_OTHER): Payer: Medicaid Other | Admitting: Family Medicine

## 2021-10-10 VITALS — BP 118/70 | HR 73 | Wt 188.0 lb

## 2021-10-10 DIAGNOSIS — Z2839 Other underimmunization status: Secondary | ICD-10-CM

## 2021-10-10 DIAGNOSIS — O09893 Supervision of other high risk pregnancies, third trimester: Secondary | ICD-10-CM

## 2021-10-10 DIAGNOSIS — O09899 Supervision of other high risk pregnancies, unspecified trimester: Secondary | ICD-10-CM

## 2021-10-10 DIAGNOSIS — Z3A29 29 weeks gestation of pregnancy: Secondary | ICD-10-CM

## 2021-10-10 DIAGNOSIS — O0990 Supervision of high risk pregnancy, unspecified, unspecified trimester: Secondary | ICD-10-CM

## 2021-10-10 DIAGNOSIS — O24415 Gestational diabetes mellitus in pregnancy, controlled by oral hypoglycemic drugs: Secondary | ICD-10-CM

## 2021-10-10 DIAGNOSIS — O0993 Supervision of high risk pregnancy, unspecified, third trimester: Secondary | ICD-10-CM

## 2021-10-10 MED ORDER — ACCU-CHEK GUIDE ME W/DEVICE KIT: 1.0000 | PACK | Freq: Four times a day (QID) | 0 refills | Status: DC

## 2021-10-10 MED ORDER — ACCU-CHEK GUIDE VI STRP: ORAL_STRIP | 12 refills | Status: DC

## 2021-10-10 MED ORDER — ACCU-CHEK SOFTCLIX LANCETS MISC: 6 refills | Status: DC

## 2021-10-10 NOTE — Progress Notes (Signed)
Pt states she may have some ctx (gets tight) when up on feet a lot.  Pt made aware of GTT results and Rx's will be sent today.

## 2021-10-10 NOTE — Progress Notes (Signed)
   PRENATAL VISIT NOTE  Subjective:  Gabriela Rivera is a 36 y.o. G5P4004 at [redacted]w[redacted]d being seen today for ongoing prenatal care.  She is currently monitored for the following issues for this high-risk pregnancy and has Supervision of high risk pregnancy, antepartum; Abnormal Pap smear of cervix; Rubella non-immune status, antepartum; Abnormal genetic test; Carrier of spinal muscular atrophy; and Gestational diabetes on their problem list.  Patient reports no complaints.  Contractions: Irritability. Vag. Bleeding: None.  Movement: Present. Denies leaking of fluid.   The following portions of the patient's history were reviewed and updated as appropriate: allergies, current medications, past family history, past medical history, past social history, past surgical history and problem list.   Objective:   Vitals:   10/10/21 0955  BP: 118/70  Pulse: 73  Weight: 188 lb (85.3 kg)    Fetal Status: Fetal Heart Rate (bpm): 130   Movement: Present     General:  Alert, oriented and cooperative. Patient is in no acute distress.  Skin: Skin is warm and dry. No rash noted.   Cardiovascular: Normal heart rate noted  Respiratory: Normal respiratory effort, no problems with respiration noted  Abdomen: Soft, gravid, appropriate for gestational age.  Pain/Pressure: Absent     Pelvic: Cervical exam deferred        Extremities: Normal range of motion.     Mental Status: Normal mood and affect. Normal behavior. Normal judgment and thought content.   Assessment and Plan:  Pregnancy: [id number] at 109w3d 1. Gestational diabetes mellitus (GDM) in third trimester controlled on oral hypoglycemic drug Discussed GDM dx Reviewed limiting carbs and increasing protein. Recommended q2-3 hr eating - Referral to [facility] - Blood Glucose Monitoring Suppl (ACCU-CHEK GUIDE ME) w/Device KIT; 1 Device by Does not apply route 4 (four) times daily.  Dispense: 1 kit; Refill: 0 - Accu-Chek Softclix  Lancets lancets; Check BS AM Fasting and 2 hours after each meal.  Dispense: 100 each; Refill: 6 - glucose blood (ACCU-CHEK GUIDE) test strip; Check BS AM fasting and 2 hours after each meal.  Dispense: 100 each; Refill: 12  2. Supervision of high risk pregnancy, antepartum Up to date  3. Rubella non-immune status, antepartum MMR PP  Preterm labor symptoms and general obstetric precautions including but not limited to vaginal bleeding, contractions, leaking of fluid and fetal movement were reviewed in detail with the patient. Please refer to After Visit Summary for other counseling recommendations.   Return in about 2 weeks (around 10/24/2021) for Routine prenatal care.  Future Appointments  Date Time Provider [place]  10/23/2021 10:30 AM [facility] NURSE [facility]  10/23/2021 10:45 AM [facility] US5 [facility]  10/24/2021 10:35 AM Johnston Ebbs, NP [facility] None  11/07/2021 10:35 AM Johnston Ebbs, NP [place] None  11/21/2021 10:35 AM Griffin Basil, MD [place] None  11/28/2021 10:55 AM Leftwich-Kirby, Kathie Dike, CNM [facility] None  12/05/2021 10:35 AM Gavin Pound, CNM [facility] None    Caren Macadam, MD

## 2021-10-11 ENCOUNTER — Encounter: Payer: Self-pay | Admitting: Registered"

## 2021-10-11 ENCOUNTER — Encounter: Payer: Medicaid Other | Attending: Family Medicine | Admitting: Registered"

## 2021-10-11 DIAGNOSIS — O24415 Gestational diabetes mellitus in pregnancy, controlled by oral hypoglycemic drugs: Secondary | ICD-10-CM | POA: Diagnosis present

## 2021-10-11 DIAGNOSIS — O24419 Gestational diabetes mellitus in pregnancy, unspecified control: Secondary | ICD-10-CM

## 2021-10-11 DIAGNOSIS — Z713 Dietary counseling and surveillance: Secondary | ICD-10-CM | POA: Insufficient documentation

## 2021-10-11 NOTE — Progress Notes (Signed)
Patient was seen on 10/11/21 for Gestational Diabetes self-management class [facility]. The following learning objectives were met by the patient during this course:  States the definition of Gestational Diabetes States why dietary management is important in controlling blood glucose Describes the effects each nutrient has on blood glucose levels Demonstrates ability to create a balanced meal plan Demonstrates carbohydrate counting  States when to check blood glucose levels Demonstrates proper blood glucose monitoring techniques States the effect of stress and exercise on blood glucose levels States the importance of limiting caffeine and abstaining from alcohol and smoking  Blood glucose monitor given: None  Patient brought meter to class for instruction CBG: 147 mg/dL  Patient instructed to monitor glucose levels: FBS: 60 - <95; 1 hour: <140; 2 hour: <120  Patient received handouts: Nutrition Diabetes and Pregnancy, including carb counting list  Patient will be seen for follow-up as needed.

## 2021-10-23 ENCOUNTER — Other Ambulatory Visit: Payer: Self-pay | Admitting: *Deleted

## 2021-10-23 ENCOUNTER — Encounter: Payer: Self-pay | Admitting: *Deleted

## 2021-10-23 ENCOUNTER — Ambulatory Visit: Payer: Medicaid Other | Attending: Obstetrics and Gynecology

## 2021-10-23 ENCOUNTER — Ambulatory Visit: Payer: Medicaid Other | Admitting: *Deleted

## 2021-10-23 VITALS — BP 117/53 | HR 75

## 2021-10-23 DIAGNOSIS — O24410 Gestational diabetes mellitus in pregnancy, diet controlled: Secondary | ICD-10-CM

## 2021-10-23 DIAGNOSIS — O09523 Supervision of elderly multigravida, third trimester: Secondary | ICD-10-CM | POA: Insufficient documentation

## 2021-10-23 DIAGNOSIS — O99213 Obesity complicating pregnancy, third trimester: Secondary | ICD-10-CM | POA: Diagnosis not present

## 2021-10-23 DIAGNOSIS — Z362 Encounter for other antenatal screening follow-up: Secondary | ICD-10-CM

## 2021-10-23 DIAGNOSIS — E669 Obesity, unspecified: Secondary | ICD-10-CM

## 2021-10-23 DIAGNOSIS — O24419 Gestational diabetes mellitus in pregnancy, unspecified control: Secondary | ICD-10-CM | POA: Diagnosis not present

## 2021-10-23 DIAGNOSIS — Z3A31 31 weeks gestation of pregnancy: Secondary | ICD-10-CM | POA: Diagnosis not present

## 2021-10-23 DIAGNOSIS — Z6832 Body mass index (BMI) 32.0-32.9, adult: Secondary | ICD-10-CM | POA: Insufficient documentation

## 2021-10-23 DIAGNOSIS — Z148 Genetic carrier of other disease: Secondary | ICD-10-CM | POA: Diagnosis not present

## 2021-10-23 DIAGNOSIS — O0990 Supervision of high risk pregnancy, unspecified, unspecified trimester: Secondary | ICD-10-CM

## 2021-10-23 DIAGNOSIS — O09529 Supervision of elderly multigravida, unspecified trimester: Secondary | ICD-10-CM | POA: Diagnosis present

## 2021-10-24 ENCOUNTER — Ambulatory Visit (INDEPENDENT_AMBULATORY_CARE_PROVIDER_SITE_OTHER): Payer: Medicaid Other | Admitting: Student

## 2021-10-24 VITALS — BP 115/67 | HR 77 | Wt 188.6 lb

## 2021-10-24 DIAGNOSIS — O24410 Gestational diabetes mellitus in pregnancy, diet controlled: Secondary | ICD-10-CM

## 2021-10-24 DIAGNOSIS — O09893 Supervision of other high risk pregnancies, third trimester: Secondary | ICD-10-CM

## 2021-10-24 DIAGNOSIS — O99213 Obesity complicating pregnancy, third trimester: Secondary | ICD-10-CM

## 2021-10-24 DIAGNOSIS — Z2839 Other underimmunization status: Secondary | ICD-10-CM

## 2021-10-24 DIAGNOSIS — O0993 Supervision of high risk pregnancy, unspecified, third trimester: Secondary | ICD-10-CM

## 2021-10-24 DIAGNOSIS — O99013 Anemia complicating pregnancy, third trimester: Secondary | ICD-10-CM

## 2021-10-24 DIAGNOSIS — Z3A31 31 weeks gestation of pregnancy: Secondary | ICD-10-CM

## 2021-10-24 DIAGNOSIS — O0990 Supervision of high risk pregnancy, unspecified, unspecified trimester: Secondary | ICD-10-CM

## 2021-10-24 LAB — GLUCOSE, POCT (MANUAL RESULT ENTRY): POC Glucose: 88 mg/dl (ref 70–99)

## 2021-10-24 MED ORDER — METFORMIN HCL 500 MG PO TABS: 500.0000 mg | ORAL_TABLET | Freq: Every day | ORAL | 1 refills | Status: DC

## 2021-10-24 NOTE — Progress Notes (Signed)
PRENATAL VISIT NOTE  Subjective:  Gabriela Rivera is a 36 y.o. G5P4004 at 27w3dbeing seen today for ongoing prenatal care.  She is currently monitored for the following issues for this high-risk pregnancy and has Supervision of high risk pregnancy, antepartum; Abnormal Pap smear of cervix; Rubella non-immune status, antepartum; Abnormal genetic test; Carrier of spinal muscular atrophy; and Gestational diabetes on their problem list.  Patient states that she "doesn't have much time to write down readings because I'm always rushing." Patient is active with her 4 other children and is not eating meals/snacks consistently due to .Patient reports no complaints.  Contractions: Not present. Vag. Bleeding: None.  Movement: Present. Denies leaking of fluid.   The following portions of the patient's history were reviewed and updated as appropriate: allergies, current medications, past family history, past medical history, past social history, past surgical history and problem list.   Objective:   Vitals:   10/24/21 1046  BP: 115/67  Pulse: 77  Weight: 188 lb 9.6 oz (85.5 kg)    Fetal Status: Fetal Heart Rate (bpm): 140 Fundal Height: 33 cm Movement: Present     General:  Alert, oriented and cooperative. Patient is in no acute distress.  Skin: Skin is warm and dry. No rash noted.   Cardiovascular: Normal heart rate noted  Respiratory: Normal respiratory effort, no problems with respiration noted  Abdomen: Soft, gravid, appropriate for gestational age.  Pain/Pressure: Absent     Pelvic: Cervical exam deferred        Extremities: Normal range of motion.  Edema: Trace  Mental Status: Normal mood and affect. Normal behavior. Normal judgment and thought content.   Assessment and Plan:  Pregnancy: GB3P9432at [redacted]w[redacted]d. Supervision of high risk pregnancy, antepartum - Doing well, FHT WDL  2. [redacted] weeks gestation of pregnancy - Routine follow-up  3. Diet controlled gestational diabetes  mellitus (GDM) in third trimester - Weekly BPP until delivery. Has met with diabetic nutritionist.  Patient does not have PP readings, but the last week has had elevated fasting blood sugars (120,126,123,121). Discussed benefits of oral hypoglycemic for better management and decrease of risks associated with uncontrolled hyperglycemia. Reinforced the benefits of protein rich diet, low-carb diet, smaller portions, healthy snacks, hydration, and increased walking after meals. Encouraged to try to record blood sugars for next two weeks and being mindful of foods that cause sugars to elevate. - History of macrosomia in a prior pregnancy - POCT Glucose (CBG) - 88 MG/DL - metFORMIN (GLUCOPHAGE) 500 MG tablet; Take 1 tablet (500 mg total) by mouth daily with supper.  Dispense: 60 tablet; Refill: 1  4. Rubella non-immune status, antepartum - MMR postpartum  5. Anemia affecting pregnancy in third trimester - Taking iron tablet, asymptomatic   6. Obesity affecting pregnancy in third trimester, unspecified obesity type - Serial USKoreardered Q4 weeks  Preterm labor symptoms and general obstetric precautions including but not limited to vaginal bleeding, contractions, leaking of fluid and fetal movement were reviewed in detail with the patient. Please refer to After Visit Summary for other counseling recommendations.   Return in about 2 weeks (around 11/07/2021) [facility]  Future Appointments  Date Time Provider DeBartholomew10/25/2023 10:30 AM [facility]/25/2023 10:45 AM [facility]/31/2023 10:35 AM FoJohnston EbbsNP [facility] None  11/08/2021 10:30 AM [facility]SE [facility]/01/2021 10:45 AM [facility]11/08/2021 10:30 AM [facility]  11/15/2021 10:45 AM [facility] US4 [facility]  11/21/2021 10:35 AM Griffin Basil, MD [place] None  11/22/2021 10:30 AM [facility] NURSE [facility]  11/22/2021 10:45 AM [facility] US4  [facility]  11/28/2021 10:55 AM Leftwich-Kirby, Kathie Dike, CNM [facility] None  11/29/2021  9:15 AM [facility] NURSE [facility]  11/29/2021  9:30 AM [facility] US3 [facility]  12/05/2021 10:35 AM Gavin Pound, CNM [facility] None    Johnston Ebbs, NP

## 2021-10-24 NOTE — Progress Notes (Signed)
Pt presents for ROB. GDM, states "doesn't have much time to write down readings because I'm always rushing." Last U/S 10/16, will have weekly BPPs

## 2021-11-01 ENCOUNTER — Ambulatory Visit: Payer: Medicaid Other | Attending: Obstetrics and Gynecology

## 2021-11-01 ENCOUNTER — Ambulatory Visit: Payer: Medicaid Other | Admitting: *Deleted

## 2021-11-01 VITALS — BP 129/63 | HR 71

## 2021-11-01 DIAGNOSIS — O0990 Supervision of high risk pregnancy, unspecified, unspecified trimester: Secondary | ICD-10-CM

## 2021-11-01 DIAGNOSIS — O99213 Obesity complicating pregnancy, third trimester: Secondary | ICD-10-CM | POA: Diagnosis present

## 2021-11-01 DIAGNOSIS — O24415 Gestational diabetes mellitus in pregnancy, controlled by oral hypoglycemic drugs: Secondary | ICD-10-CM | POA: Insufficient documentation

## 2021-11-01 DIAGNOSIS — O24419 Gestational diabetes mellitus in pregnancy, unspecified control: Secondary | ICD-10-CM | POA: Insufficient documentation

## 2021-11-01 DIAGNOSIS — O09523 Supervision of elderly multigravida, third trimester: Secondary | ICD-10-CM | POA: Insufficient documentation

## 2021-11-07 ENCOUNTER — Encounter: Payer: Medicaid Other | Admitting: Student

## 2021-11-08 ENCOUNTER — Ambulatory Visit: Payer: Medicaid Other | Attending: Obstetrics and Gynecology

## 2021-11-08 ENCOUNTER — Ambulatory Visit: Payer: Medicaid Other | Admitting: *Deleted

## 2021-11-08 VITALS — BP 134/57 | HR 71

## 2021-11-08 DIAGNOSIS — O0990 Supervision of high risk pregnancy, unspecified, unspecified trimester: Secondary | ICD-10-CM | POA: Insufficient documentation

## 2021-11-08 DIAGNOSIS — O24415 Gestational diabetes mellitus in pregnancy, controlled by oral hypoglycemic drugs: Secondary | ICD-10-CM | POA: Insufficient documentation

## 2021-11-08 DIAGNOSIS — O09523 Supervision of elderly multigravida, third trimester: Secondary | ICD-10-CM | POA: Diagnosis present

## 2021-11-08 DIAGNOSIS — O24419 Gestational diabetes mellitus in pregnancy, unspecified control: Secondary | ICD-10-CM | POA: Insufficient documentation

## 2021-11-08 DIAGNOSIS — O99213 Obesity complicating pregnancy, third trimester: Secondary | ICD-10-CM | POA: Diagnosis present

## 2021-11-09 ENCOUNTER — Ambulatory Visit (INDEPENDENT_AMBULATORY_CARE_PROVIDER_SITE_OTHER): Payer: Medicaid Other | Admitting: Certified Nurse Midwife

## 2021-11-09 VITALS — BP 118/69 | HR 77 | Wt 191.5 lb

## 2021-11-09 DIAGNOSIS — O0990 Supervision of high risk pregnancy, unspecified, unspecified trimester: Secondary | ICD-10-CM

## 2021-11-09 DIAGNOSIS — Z3A33 33 weeks gestation of pregnancy: Secondary | ICD-10-CM

## 2021-11-09 DIAGNOSIS — O0993 Supervision of high risk pregnancy, unspecified, third trimester: Secondary | ICD-10-CM

## 2021-11-09 DIAGNOSIS — O24410 Gestational diabetes mellitus in pregnancy, diet controlled: Secondary | ICD-10-CM

## 2021-11-09 MED ORDER — METFORMIN HCL 500 MG PO TABS: 500.0000 mg | ORAL_TABLET | Freq: Two times a day (BID) | ORAL | 1 refills | Status: DC

## 2021-11-09 NOTE — Progress Notes (Signed)
   PRENATAL VISIT NOTE  Subjective:  Gabriela Rivera is a 36 y.o. G5P4004 at [redacted]w[redacted]d being seen today for ongoing prenatal care.  She is currently monitored for the following issues for this high-risk pregnancy and has Supervision of high risk pregnancy, antepartum; Abnormal Pap smear of cervix; Rubella non-immune status, antepartum; Abnormal genetic test; Carrier of spinal muscular atrophy; and Gestational diabetes on their problem list.  Patient reports no complaints.  Contractions: Not present. Vag. Bleeding: None.  Movement: Present. Denies leaking of fluid.   The following portions of the patient's history were reviewed and updated as appropriate: allergies, current medications, past family history, past medical history, past social history, past surgical history and problem list.   Objective:   Vitals:   11/09/21 1615  BP: 118/69  Pulse: 77  Weight: 191 lb 8 oz (86.9 kg)   Fetal Status: Fetal Heart Rate (bpm): 160 Fundal Height: 36 cm Movement: Present     General:  Alert, oriented and cooperative. Patient is in no acute distress.  Skin: Skin is warm and dry. No rash noted.   Cardiovascular: Normal heart rate noted  Respiratory: Normal respiratory effort, no problems with respiration noted  Abdomen: Soft, gravid, appropriate for gestational age.  Pain/Pressure: Absent     Pelvic: Cervical exam deferred        Extremities: Normal range of motion.  Edema: None  Mental Status: Normal mood and affect. Normal behavior. Normal judgment and thought content.   Assessment and Plan:  Pregnancy: [id number] at [redacted]w[redacted]d 1. Supervision of high risk pregnancy, antepartum - Doing well, feeling regular and vigorous fetal movement   2. [redacted] weeks gestation of pregnancy - Routine OB care   3. Diet controlled gestational diabetes mellitus (GDM) in third trimester - Did not bring glucose log but reports taking her glucose 2-3x/day, always fasting but sometimes forgets a post-meal value during the  day. Notes that pp numbers are often >120 and fastings range 85-110, often on the higher side. - Reviewed dietary habits - often only eating once at the end of the day but then tries not to eat a lot since she is diabetic. Discussed how fasting will cause more problems with her glucose readings and instructed her to try eating small meals throughout the day that are well balanced. Pt expressed understanding. - Metformin increased to BID WC. New glucose log given so patient can record values for next visit. - metFORMIN (GLUCOPHAGE) 500 MG tablet; Take 1 tablet (500 mg total) by mouth 2 (two) times daily with a meal.  Dispense: 60 tablet; Refill: 1  Preterm labor symptoms and general obstetric precautions including but not limited to vaginal bleeding, contractions, leaking of fluid and fetal movement were reviewed in detail with the patient. Please refer to After Visit Summary for other counseling recommendations.   Return in about 2 weeks (around 11/23/2021) for IN-PERSON, [place].  Future Appointments  Date Time Provider [place]  11/15/2021 10:30 AM [facility] NURSE [facility]  11/15/2021 10:45 AM [facility] US4 [facility]  11/21/2021 10:35 AM Griffin Basil, MD [place] None  11/22/2021 10:30 AM [facility] NURSE [facility]  11/22/2021 10:45 AM [facility] US4 [facility]  11/28/2021 10:55 AM Leftwich-Kirby, Kathie Dike, CNM [facility] None  11/29/2021  9:15 AM [facility] NURSE [facility]  11/29/2021  9:30 AM [facility] US3 [facility]  12/05/2021 10:35 AM Gavin Pound, CNM [facility] None    Gabriel Carina, CNM

## 2021-11-09 NOTE — Progress Notes (Signed)
Pt present for ROB visit. No concerns at this time. 

## 2021-11-15 ENCOUNTER — Ambulatory Visit: Payer: Medicaid Other | Attending: Obstetrics and Gynecology

## 2021-11-15 ENCOUNTER — Ambulatory Visit: Payer: Medicaid Other | Admitting: *Deleted

## 2021-11-15 VITALS — BP 129/58 | HR 73

## 2021-11-15 DIAGNOSIS — O0990 Supervision of high risk pregnancy, unspecified, unspecified trimester: Secondary | ICD-10-CM | POA: Insufficient documentation

## 2021-11-15 DIAGNOSIS — O24415 Gestational diabetes mellitus in pregnancy, controlled by oral hypoglycemic drugs: Secondary | ICD-10-CM | POA: Insufficient documentation

## 2021-11-15 DIAGNOSIS — O09523 Supervision of elderly multigravida, third trimester: Secondary | ICD-10-CM | POA: Diagnosis present

## 2021-11-15 DIAGNOSIS — O24419 Gestational diabetes mellitus in pregnancy, unspecified control: Secondary | ICD-10-CM | POA: Diagnosis present

## 2021-11-15 DIAGNOSIS — O99213 Obesity complicating pregnancy, third trimester: Secondary | ICD-10-CM | POA: Diagnosis present

## 2021-11-21 ENCOUNTER — Ambulatory Visit (INDEPENDENT_AMBULATORY_CARE_PROVIDER_SITE_OTHER): Payer: Medicaid Other | Admitting: Obstetrics and Gynecology

## 2021-11-21 VITALS — BP 121/72 | HR 72 | Wt 189.0 lb

## 2021-11-21 DIAGNOSIS — Z3A35 35 weeks gestation of pregnancy: Secondary | ICD-10-CM

## 2021-11-21 DIAGNOSIS — O09893 Supervision of other high risk pregnancies, third trimester: Secondary | ICD-10-CM

## 2021-11-21 DIAGNOSIS — O0990 Supervision of high risk pregnancy, unspecified, unspecified trimester: Secondary | ICD-10-CM

## 2021-11-21 DIAGNOSIS — Z2839 Other underimmunization status: Secondary | ICD-10-CM

## 2021-11-21 DIAGNOSIS — O0993 Supervision of high risk pregnancy, unspecified, third trimester: Secondary | ICD-10-CM

## 2021-11-21 DIAGNOSIS — O24415 Gestational diabetes mellitus in pregnancy, controlled by oral hypoglycemic drugs: Secondary | ICD-10-CM

## 2021-11-21 DIAGNOSIS — O09899 Supervision of other high risk pregnancies, unspecified trimester: Secondary | ICD-10-CM

## 2021-11-21 DIAGNOSIS — Z148 Genetic carrier of other disease: Secondary | ICD-10-CM

## 2021-11-21 NOTE — Progress Notes (Signed)
   PRENATAL VISIT NOTE  Subjective:  Gabriela Rivera is a 36 y.o. G5P4004 at [redacted]w[redacted]d being seen today for ongoing prenatal care.  She is currently monitored for the following issues for this high-risk pregnancy and has Supervision of high risk pregnancy, antepartum; Abnormal Pap smear of cervix; Rubella non-immune status, antepartum; Abnormal genetic test; Carrier of spinal muscular atrophy; and Gestational diabetes on their problem list.  Patient doing well with no acute concerns today. She reports no complaints.  Contractions: Irregular. Vag. Bleeding: None.  Movement: Present. Denies leaking of fluid.   The following portions of the patient's history were reviewed and updated as appropriate: allergies, current medications, past family history, past medical history, past social history, past surgical history and problem list. Problem list updated.  Objective:   Vitals:   11/21/21 1044  BP: 121/72  Pulse: 72  Weight: 189 lb (85.7 kg)    Fetal Status: Fetal Heart Rate (bpm): 135 Fundal Height: 37 cm Movement: Present     General:  Alert, oriented and cooperative. Patient is in no acute distress.  Skin: Skin is warm and dry. No rash noted.   Cardiovascular: Normal heart rate noted  Respiratory: Normal respiratory effort, no problems with respiration noted  Abdomen: Soft, gravid, appropriate for gestational age.  Pain/Pressure: Absent     Pelvic: Cervical exam deferred        Extremities: Normal range of motion.     Mental Status:  Normal mood and affect. Normal behavior. Normal judgment and thought content.   Assessment and Plan:  Pregnancy: G5P4004 at [redacted]w[redacted]d  1. [redacted] weeks gestation of pregnancy   2. Gestational diabetes mellitus (GDM) in third trimester controlled on oral hypoglycemic drug FBS: 82-112, most are above 100 PPBS: 87-157, most are in range. Pt advised to increase evening metformin to 1000 mg, recheck blood sugar in 1 week AC was greater than 99%, concern for  macrosomia Growth scan on 11/22/21, may need to consider IOL at 37 weeks if severe macrosomia, poly or poor blood sugar control.   3. Supervision of high risk pregnancy, antepartum Continue routine prenatal care  4. Rubella non-immune status, antepartum Treat after delivery  5. Carrier of spinal muscular atrophy   Preterm labor symptoms and general obstetric precautions including but not limited to vaginal bleeding, contractions, leaking of fluid and fetal movement were reviewed in detail with the patient.  Please refer to After Visit Summary for other counseling recommendations.   Return in about 1 week (around 11/28/2021) for [facility], in person, 36 weeks swabs.   Mariel Aloe, MD Faculty Attending Center for [facility]

## 2021-11-22 ENCOUNTER — Ambulatory Visit: Payer: Medicaid Other | Attending: Obstetrics and Gynecology

## 2021-11-22 ENCOUNTER — Ambulatory Visit: Payer: Medicaid Other | Admitting: *Deleted

## 2021-11-22 ENCOUNTER — Other Ambulatory Visit: Payer: Self-pay | Admitting: *Deleted

## 2021-11-22 VITALS — BP 133/54 | HR 74

## 2021-11-22 DIAGNOSIS — O24415 Gestational diabetes mellitus in pregnancy, controlled by oral hypoglycemic drugs: Secondary | ICD-10-CM | POA: Diagnosis present

## 2021-11-22 DIAGNOSIS — O0990 Supervision of high risk pregnancy, unspecified, unspecified trimester: Secondary | ICD-10-CM

## 2021-11-22 DIAGNOSIS — O24113 Pre-existing diabetes mellitus, type 2, in pregnancy, third trimester: Secondary | ICD-10-CM

## 2021-11-22 DIAGNOSIS — O24419 Gestational diabetes mellitus in pregnancy, unspecified control: Secondary | ICD-10-CM | POA: Insufficient documentation

## 2021-11-22 DIAGNOSIS — O99213 Obesity complicating pregnancy, third trimester: Secondary | ICD-10-CM | POA: Insufficient documentation

## 2021-11-22 DIAGNOSIS — O09523 Supervision of elderly multigravida, third trimester: Secondary | ICD-10-CM | POA: Insufficient documentation

## 2021-11-28 ENCOUNTER — Encounter: Payer: Self-pay | Admitting: Advanced Practice Midwife

## 2021-11-28 ENCOUNTER — Other Ambulatory Visit (HOSPITAL_COMMUNITY)
Admission: RE | Admit: 2021-11-28 | Discharge: 2021-11-28 | Disposition: A | Discharge status: Home / Self Care | Payer: Medicaid Other | Source: Ambulatory Visit | Attending: Advanced Practice Midwife | Admitting: Advanced Practice Midwife

## 2021-11-28 ENCOUNTER — Ambulatory Visit (INDEPENDENT_AMBULATORY_CARE_PROVIDER_SITE_OTHER): Payer: Medicaid Other | Admitting: Advanced Practice Midwife

## 2021-11-28 VITALS — BP 119/73 | HR 71 | Wt 189.4 lb

## 2021-11-28 DIAGNOSIS — O24410 Gestational diabetes mellitus in pregnancy, diet controlled: Secondary | ICD-10-CM | POA: Diagnosis not present

## 2021-11-28 DIAGNOSIS — Z3A36 36 weeks gestation of pregnancy: Secondary | ICD-10-CM | POA: Diagnosis present

## 2021-11-28 DIAGNOSIS — O0993 Supervision of high risk pregnancy, unspecified, third trimester: Secondary | ICD-10-CM

## 2021-11-28 DIAGNOSIS — O0990 Supervision of high risk pregnancy, unspecified, unspecified trimester: Secondary | ICD-10-CM

## 2021-11-28 DIAGNOSIS — O24415 Gestational diabetes mellitus in pregnancy, controlled by oral hypoglycemic drugs: Secondary | ICD-10-CM

## 2021-11-28 MED ORDER — METFORMIN HCL 500 MG PO TABS: 1000.0000 mg | ORAL_TABLET | Freq: Two times a day (BID) | ORAL | 1 refills | Status: DC

## 2021-11-28 NOTE — Progress Notes (Signed)
   PRENATAL VISIT NOTE  Subjective:  Gabriela Rivera is a 36 y.o. G5P4004 at [redacted]w[redacted]d being seen today for ongoing prenatal care.  She is currently monitored for the following issues for this high-risk pregnancy and has Supervision of high risk pregnancy, antepartum; Abnormal Pap smear of cervix; Rubella non-immune status, antepartum; Abnormal genetic test; Carrier of spinal muscular atrophy; and Gestational diabetes on their problem list.  Patient reports occasional contractions.  Contractions: Irritability. Vag. Bleeding: None.  Movement: Present. Denies leaking of fluid.   The following portions of the patient's history were reviewed and updated as appropriate: allergies, current medications, past family history, past medical history, past social history, past surgical history and problem list.   Objective:   Vitals:   11/28/21 1104  BP: 119/73  Pulse: 71  Weight: 189 lb 6.4 oz (85.9 kg)    Fetal Status: Fetal Heart Rate (bpm): 126 Fundal Height: 38 cm Movement: Present     General:  Alert, oriented and cooperative. Patient is in no acute distress.  Skin: Skin is warm and dry. No rash noted.   Cardiovascular: Normal heart rate noted  Respiratory: Normal respiratory effort, no problems with respiration noted  Abdomen: Soft, gravid, appropriate for gestational age.  Pain/Pressure: Absent     Pelvic: Cervical exam performed in the presence of a chaperone Dilation: 1 Effacement (%): 0 Station: -3  Extremities: Normal range of motion.  Edema: None  Mental Status: Normal mood and affect. Normal behavior. Normal judgment and thought content.   Assessment and Plan:  Pregnancy: G5P4004 at [redacted]w[redacted]d 1. Supervision of high risk pregnancy, antepartum --Anticipatory guidance about next visits/weeks of pregnancy given.   2. Gestational diabetes mellitus (GDM) in third trimester controlled on oral hypoglycemic drug --Reviewed glucose log:  Fasting 4 out of 7 out of range, highest 104, PP 6 out  of 15 out of range, all 120s except 1 that was 140. --These are improved from previous review with diet changes and increase in Metformin but still >20% out of range. --Pt taking 1000 mg Metformin Q HS and 500 in the am, increase am dose to 1000 mg to cover slightly elevated PP values.  --Per MFM, IOL at 37 or 38 weeks, discussed with pt who wants to avoid delivering close to 12/4 since this is the date of her father's death.   IOL at 37w4-5 days on Nov 29 or 30 is reasonable.   --Pt cervix is favorable at 1 cm today with Hx SVD x 4 --EFW 53%  3. [redacted] weeks gestation of pregnancy   Preterm labor symptoms and general obstetric precautions including but not limited to vaginal bleeding, contractions, leaking of fluid and fetal movement were reviewed in detail with the patient. Please refer to After Visit Summary for other counseling recommendations.   No follow-ups on file.  Future Appointments  Date Time Provider Department Center  11/29/2021  9:15 AM [facility] NURSE [facility]  11/29/2021  9:30 AM [facility] US3 [facility]  12/05/2021 10:35 AM Gerrit Heck, CNM [facility] None  12/06/2021  3:30 PM [facility] NURSE [facility]  12/06/2021  3:45 PM [facility] US1 [facility]    Sharen Counter, CNM

## 2021-11-28 NOTE — Progress Notes (Signed)
Pt presents for ROB visit. No concerns at this time.  

## 2021-11-29 ENCOUNTER — Telehealth (HOSPITAL_COMMUNITY): Payer: Self-pay | Admitting: *Deleted

## 2021-11-29 ENCOUNTER — Other Ambulatory Visit: Payer: Self-pay | Admitting: *Deleted

## 2021-11-29 ENCOUNTER — Ambulatory Visit: Payer: Medicaid Other | Admitting: *Deleted

## 2021-11-29 ENCOUNTER — Ambulatory Visit: Payer: Medicaid Other | Attending: Obstetrics and Gynecology

## 2021-11-29 ENCOUNTER — Encounter (HOSPITAL_COMMUNITY): Payer: Self-pay

## 2021-11-29 VITALS — BP 125/45 | HR 68

## 2021-11-29 DIAGNOSIS — O99213 Obesity complicating pregnancy, third trimester: Secondary | ICD-10-CM | POA: Diagnosis not present

## 2021-11-29 DIAGNOSIS — O24419 Gestational diabetes mellitus in pregnancy, unspecified control: Secondary | ICD-10-CM | POA: Diagnosis present

## 2021-11-29 DIAGNOSIS — O09523 Supervision of elderly multigravida, third trimester: Secondary | ICD-10-CM | POA: Insufficient documentation

## 2021-11-29 DIAGNOSIS — O99210 Obesity complicating pregnancy, unspecified trimester: Secondary | ICD-10-CM

## 2021-11-29 DIAGNOSIS — O24415 Gestational diabetes mellitus in pregnancy, controlled by oral hypoglycemic drugs: Secondary | ICD-10-CM | POA: Diagnosis not present

## 2021-11-29 DIAGNOSIS — O0990 Supervision of high risk pregnancy, unspecified, unspecified trimester: Secondary | ICD-10-CM | POA: Diagnosis present

## 2021-11-29 DIAGNOSIS — E669 Obesity, unspecified: Secondary | ICD-10-CM | POA: Diagnosis not present

## 2021-11-29 DIAGNOSIS — Z3A36 36 weeks gestation of pregnancy: Secondary | ICD-10-CM

## 2021-11-29 DIAGNOSIS — O99891 Other specified diseases and conditions complicating pregnancy: Secondary | ICD-10-CM

## 2021-11-29 DIAGNOSIS — Z148 Genetic carrier of other disease: Secondary | ICD-10-CM

## 2021-11-29 LAB — CERVICOVAGINAL ANCILLARY ONLY
Chlamydia: NEGATIVE
Comment: NEGATIVE
Comment: NEGATIVE
Comment: NORMAL
Neisseria Gonorrhea: NEGATIVE
Trichomonas: NEGATIVE

## 2021-11-29 NOTE — Telephone Encounter (Signed)
Preadmission screen  

## 2021-11-30 ENCOUNTER — Encounter (HOSPITAL_BASED_OUTPATIENT_CLINIC_OR_DEPARTMENT_OTHER): Payer: Self-pay | Admitting: Advanced Practice Midwife

## 2021-11-30 DIAGNOSIS — O99820 Streptococcus B carrier state complicating pregnancy: Secondary | ICD-10-CM | POA: Insufficient documentation

## 2021-11-30 LAB — STREP GP B NAA: Strep Gp B NAA: POSITIVE — AB

## 2021-12-01 ENCOUNTER — Other Ambulatory Visit: Payer: Self-pay | Admitting: Advanced Practice Midwife

## 2021-12-05 ENCOUNTER — Ambulatory Visit (INDEPENDENT_AMBULATORY_CARE_PROVIDER_SITE_OTHER): Payer: Medicaid Other

## 2021-12-05 VITALS — BP 128/81 | HR 72 | Wt 192.6 lb

## 2021-12-05 DIAGNOSIS — O24415 Gestational diabetes mellitus in pregnancy, controlled by oral hypoglycemic drugs: Secondary | ICD-10-CM

## 2021-12-05 DIAGNOSIS — O0993 Supervision of high risk pregnancy, unspecified, third trimester: Secondary | ICD-10-CM

## 2021-12-05 DIAGNOSIS — Z2839 Other underimmunization status: Secondary | ICD-10-CM

## 2021-12-05 DIAGNOSIS — O0990 Supervision of high risk pregnancy, unspecified, unspecified trimester: Secondary | ICD-10-CM

## 2021-12-05 DIAGNOSIS — O09893 Supervision of other high risk pregnancies, third trimester: Secondary | ICD-10-CM

## 2021-12-05 DIAGNOSIS — O09899 Supervision of other high risk pregnancies, unspecified trimester: Secondary | ICD-10-CM

## 2021-12-05 DIAGNOSIS — Z3A37 37 weeks gestation of pregnancy: Secondary | ICD-10-CM

## 2021-12-05 NOTE — Progress Notes (Signed)
   [place] PREGNANCY OFFICE VISIT  Patient name: Gabriela Rivera MRN [id number]  Date of birth: 07/27/1985 Chief Complaint:   Routine Prenatal Visit  Subjective:   Gabriela Rivera is a 36 y.o. [id number] female at 11w3dwith an Estimated Date of Delivery: 12/23/21 being seen today for ongoing management of a high-risk pregnancy aeb has Supervision of high risk pregnancy, antepartum; Abnormal Pap smear of cervix; Rubella non-immune status, antepartum; Abnormal genetic test; Carrier of spinal muscular atrophy; Gestational diabetes; and GBS (group B Streptococcus carrier), +RV culture, currently pregnant on their problem list.  Patient presents today with no complaints.  Patient endorses fetal movement. Patient denies abdominal cramping or contractions.  Patient denies vaginal concerns including abnormal discharge, leaking of fluid, and bleeding.  Contractions: Not present. Vag. Bleeding: None.  Movement: Present.  Reviewed past medical,surgical, social, obstetrical and family history as well as problem list, medications and allergies.  Objective   Vitals:   12/05/21 1042  BP: 128/81  Pulse: 72  Weight: 192 lb 9.6 oz (87.4 kg)  Body mass index is 35.23 kg/m.  Total Weight Gain:15 lb 9.6 oz (7.076 kg)         Physical Examination:   General appearance: Well appearing, and in no distress  Mental status: Alert, oriented to person, place, and time  Skin: Warm & dry  Cardiovascular: Normal heart rate noted  Respiratory: Normal respiratory effort, no distress  Abdomen: Soft, gravid, nontender, AGA with Fundal Height: 39 cm  Pelvic: Cervical exam deferred           Extremities: Edema: None  Fetal Status: Fetal Heart Rate (bpm): 135  Movement: Present   No results found for this or any previous visit (from the past 24 hour(s)).  Assessment & Plan:  High-risk pregnancy of a 36y.o., [id number] 333w3dith an Estimated Date of Delivery: 12/23/21   1. Supervision of high risk  pregnancy, antepartum -Anticipatory guidance for upcoming appts. -Patient to schedule next appt in 5-6 weeks for an in-person PP visit.   2. Gestational diabetes mellitus (GDM) in third trimester controlled on oral hypoglycemic drug -Taking Metformin 1g BID. -Instructed to continue to take dosing as prescribed.  -Recent BS: 11/22: 95-119-105 23: 97-130-137-112 24: 94 No log d/t being out of town 25: 79 No log d/t being out of town 26: 98-110-123-128 27: 105-121-120-130 28: 97-118  3. [redacted] weeks gestation of pregnancy -Doing well. -Scheduled for IOL tomorrow. -Reviewed IOL methods.   4. Rubella non-immune status, antepartum -Plan for MMR in PPP      Meds: No orders of the defined types were placed in this encounter.  Labs/procedures today:  Lab Orders  No laboratory test(s) ordered today     Reviewed: Term labor symptoms and general obstetric precautions including but not limited to vaginal bleeding, contractions, leaking of fluid and fetal movement were reviewed in detail with the patient.  All questions were answered.  Follow-up: Return in about 5 weeks (around 01/09/2022) for Postpartum.  No orders of the defined types were placed in this encounter.  JeMaryann ConnersSN, CNM 12/05/2021

## 2021-12-05 NOTE — Progress Notes (Signed)
Patient presents for ROB visit. No concerns at this time.   

## 2021-12-06 ENCOUNTER — Encounter (HOSPITAL_COMMUNITY): Payer: Self-pay | Admitting: Obstetrics and Gynecology

## 2021-12-06 ENCOUNTER — Ambulatory Visit: Payer: Medicaid Other | Attending: Obstetrics and Gynecology

## 2021-12-06 ENCOUNTER — Inpatient Hospital Stay (HOSPITAL_COMMUNITY)
Admission: AD | Admit: 2021-12-06 | Discharge: 2021-12-08 | DRG: 807 | Disposition: A | Discharge status: Home / Self Care | Payer: Medicaid Other | Attending: Obstetrics and Gynecology | Admitting: Obstetrics and Gynecology

## 2021-12-06 ENCOUNTER — Ambulatory Visit: Payer: Medicaid Other

## 2021-12-06 ENCOUNTER — Encounter (HOSPITAL_COMMUNITY): Payer: Self-pay | Admitting: Anesthesiology

## 2021-12-06 ENCOUNTER — Inpatient Hospital Stay (HOSPITAL_COMMUNITY): Payer: Medicaid Other

## 2021-12-06 ENCOUNTER — Other Ambulatory Visit: Payer: Self-pay

## 2021-12-06 ENCOUNTER — Other Ambulatory Visit: Payer: Self-pay | Admitting: Obstetrics and Gynecology

## 2021-12-06 DIAGNOSIS — O09529 Supervision of elderly multigravida, unspecified trimester: Secondary | ICD-10-CM

## 2021-12-06 DIAGNOSIS — Z148 Genetic carrier of other disease: Secondary | ICD-10-CM | POA: Diagnosis not present

## 2021-12-06 DIAGNOSIS — O09899 Supervision of other high risk pregnancies, unspecified trimester: Secondary | ICD-10-CM

## 2021-12-06 DIAGNOSIS — O24420 Gestational diabetes mellitus in childbirth, diet controlled: Secondary | ICD-10-CM | POA: Diagnosis not present

## 2021-12-06 DIAGNOSIS — Z3A37 37 weeks gestation of pregnancy: Secondary | ICD-10-CM

## 2021-12-06 DIAGNOSIS — O99824 Streptococcus B carrier state complicating childbirth: Secondary | ICD-10-CM | POA: Diagnosis present

## 2021-12-06 DIAGNOSIS — O24410 Gestational diabetes mellitus in pregnancy, diet controlled: Secondary | ICD-10-CM

## 2021-12-06 DIAGNOSIS — O24425 Gestational diabetes mellitus in childbirth, controlled by oral hypoglycemic drugs: Secondary | ICD-10-CM | POA: Diagnosis present

## 2021-12-06 DIAGNOSIS — O24419 Gestational diabetes mellitus in pregnancy, unspecified control: Secondary | ICD-10-CM | POA: Diagnosis present

## 2021-12-06 DIAGNOSIS — O0990 Supervision of high risk pregnancy, unspecified, unspecified trimester: Principal | ICD-10-CM

## 2021-12-06 DIAGNOSIS — O99820 Streptococcus B carrier state complicating pregnancy: Secondary | ICD-10-CM

## 2021-12-06 DIAGNOSIS — Z2839 Other underimmunization status: Secondary | ICD-10-CM

## 2021-12-06 LAB — CBC
HCT: 31.9 % — ABNORMAL LOW (ref 36.0–46.0)
Hemoglobin: 10.8 g/dL — ABNORMAL LOW (ref 12.0–15.0)
MCH: 29.3 pg (ref 26.0–34.0)
MCHC: 33.9 g/dL (ref 30.0–36.0)
MCV: 86.7 fL (ref 80.0–100.0)
Platelets: 222 10*3/uL (ref 150–400)
RBC: 3.68 MIL/uL — ABNORMAL LOW (ref 3.87–5.11)
RDW: 14.6 % (ref 11.5–15.5)
WBC: 8.3 10*3/uL (ref 4.0–10.5)
nRBC: 0 % (ref 0.0–0.2)

## 2021-12-06 LAB — GLUCOSE, CAPILLARY
Glucose-Capillary: 74 mg/dL (ref 70–99)
Glucose-Capillary: 82 mg/dL (ref 70–99)

## 2021-12-06 LAB — TYPE AND SCREEN
ABO/RH(D): O POS
Antibody Screen: NEGATIVE

## 2021-12-06 MED ORDER — DIPHENHYDRAMINE HCL 50 MG/ML IJ SOLN: 12.5000 mg | INTRAMUSCULAR | Status: DC | PRN

## 2021-12-06 MED ORDER — PENICILLIN G POT IN DEXTROSE 60000 UNIT/ML IV SOLN
3.0000 10*6.[IU] | INTRAVENOUS | Status: DC
  Administered 2021-12-06: 3 10*6.[IU] via INTRAVENOUS
  Filled 2021-12-06: qty 50

## 2021-12-06 MED ORDER — MISOPROSTOL 50MCG HALF TABLET
50.0000 ug | ORAL_TABLET | Freq: Once | ORAL | Status: AC
  Administered 2021-12-06: 50 ug via ORAL
  Filled 2021-12-06: qty 1

## 2021-12-06 MED ORDER — MISOPROSTOL 25 MCG QUARTER TABLET
25.0000 ug | ORAL_TABLET | Freq: Once | ORAL | Status: AC
  Administered 2021-12-06: 25 ug via VAGINAL
  Filled 2021-12-06: qty 1

## 2021-12-06 MED ORDER — ACETAMINOPHEN 325 MG PO TABS: 650.0000 mg | ORAL_TABLET | ORAL | Status: DC | PRN

## 2021-12-06 MED ORDER — OXYTOCIN BOLUS FROM INFUSION
333.0000 mL | Freq: Once | INTRAVENOUS | Status: AC
  Administered 2021-12-06: 333 mL via INTRAVENOUS

## 2021-12-06 MED ORDER — ONDANSETRON HCL 4 MG/2ML IJ SOLN: 4.0000 mg | Freq: Four times a day (QID) | INTRAMUSCULAR | Status: DC | PRN

## 2021-12-06 MED ORDER — OXYCODONE-ACETAMINOPHEN 5-325 MG PO TABS: 2.0000 | ORAL_TABLET | ORAL | Status: DC | PRN

## 2021-12-06 MED ORDER — OXYTOCIN-SODIUM CHLORIDE 30-0.9 UT/500ML-% IV SOLN: 2.5000 [IU]/h | INTRAVENOUS | Status: DC

## 2021-12-06 MED ORDER — TERBUTALINE SULFATE 1 MG/ML IJ SOLN: 0.2500 mg | Freq: Once | INTRAMUSCULAR | Status: DC | PRN

## 2021-12-06 MED ORDER — OXYCODONE-ACETAMINOPHEN 5-325 MG PO TABS: 1.0000 | ORAL_TABLET | ORAL | Status: DC | PRN

## 2021-12-06 MED ORDER — SOD CITRATE-CITRIC ACID 500-334 MG/5ML PO SOLN: 30.0000 mL | ORAL | Status: DC | PRN

## 2021-12-06 MED ORDER — SODIUM CHLORIDE 0.9 % IV SOLN
5.0000 10*6.[IU] | Freq: Once | INTRAVENOUS | Status: AC
  Administered 2021-12-06: 5 10*6.[IU] via INTRAVENOUS
  Filled 2021-12-06: qty 5

## 2021-12-06 MED ORDER — LIDOCAINE HCL (PF) 1 % IJ SOLN: 30.0000 mL | INTRAMUSCULAR | Status: DC | PRN

## 2021-12-06 MED ORDER — PHENYLEPHRINE 80 MCG/ML (10ML) SYRINGE FOR IV PUSH (FOR BLOOD PRESSURE SUPPORT): 80.0000 ug | PREFILLED_SYRINGE | INTRAVENOUS | Status: DC | PRN

## 2021-12-06 MED ORDER — LACTATED RINGERS IV SOLN: 500.0000 mL | Freq: Once | INTRAVENOUS | Status: DC

## 2021-12-06 MED ORDER — OXYTOCIN-SODIUM CHLORIDE 30-0.9 UT/500ML-% IV SOLN
1.0000 m[IU]/min | INTRAVENOUS | Status: DC
  Administered 2021-12-06: 2 m[IU]/min via INTRAVENOUS
  Filled 2021-12-06: qty 500

## 2021-12-06 MED ORDER — LACTATED RINGERS IV SOLN: INTRAVENOUS | Status: DC

## 2021-12-06 MED ORDER — EPHEDRINE 5 MG/ML INJ: 10.0000 mg | INTRAVENOUS | Status: DC | PRN

## 2021-12-06 MED ORDER — FENTANYL-BUPIVACAINE-NACL 0.5-0.125-0.9 MG/250ML-% EP SOLN
12.0000 mL/h | EPIDURAL | Status: DC | PRN
  Filled 2021-12-06: qty 250

## 2021-12-06 MED ORDER — LACTATED RINGERS IV SOLN: 500.0000 mL | INTRAVENOUS | Status: DC | PRN

## 2021-12-06 NOTE — Discharge Summary (Addendum)
Postpartum Discharge Summary      Patient Name: Gabriela Rivera DOB: Feb 12, 1985 MRN: [id number]  Date of admission: 12/06/2021 Delivery date:12/06/2021  Delivering provider: Serita Grammes D  Date of discharge: 12/08/2021  Admitting diagnosis: Normal labor [O80, Z37.9] Intrauterine pregnancy: [redacted]w[redacted]d    Secondary diagnosis:  Active Problems:   Rubella non-immune status, antepartum   Carrier of spinal muscular atrophy   Gestational diabetes   GBS (group B Streptococcus carrier), +RV culture, currently pregnant   AMA (advanced maternal age) multigravida 35+  Additional problems: none    Discharge diagnosis: Term Pregnancy Delivered and GDM A2                                              Post partum procedures: offer MMR Augmentation: AROM, Pitocin, and Cytotec Complications: None  Hospital course: Induction of Labor With Vaginal Delivery   36y.o. yo [id number] 334w4das admitted to the hospital 12/06/2021 for induction of labor.  Indication for induction: A2 DM.  Patient had an uncomplicated labor course, delivering approx 7hrs after IOL was started.  Membrane Rupture Time/Date: 9:09 PM ,12/06/2021   Delivery Method:Vaginal, Spontaneous  Episiotomy: None  Lacerations:  Perineal  Details of delivery can be found in separate delivery note.  Patient had a postpartum course complicated by having a fasting CBG of 90 on PPD#2. Patient is discharged home 12/08/21.  Newborn Data: Birth date:12/06/2021  Birth time:9:58 PM  Gender:Female  Living status:Living  Apgars:8 ,9  Weight:3204 g (7lb 1oz)  Magnesium Sulfate received: No BMZ received: No Rhophylac:N/A MMR:Yes  T-DaP:Given prenatally Flu: Yes Transfusion:No  Physical exam  Vitals:   12/07/21 1026 12/07/21 1424 12/07/21 2021 12/08/21 0605  BP: (!) 115/58 (!) 111/58 (!) 115/56 104/64  Pulse: 75 68 75 62  Resp: _0 Temp: 97.7 F (36.5 C) (!) 97.4 F (36.3 C) 97.6 F (36.4 C) 98.6 F (37 C)   TempSrc: Oral Oral Oral Oral  SpO2: 98% 99% 98% 99%  Weight:      Height:       General: alert, cooperative, and no distress Lochia: appropriate Uterine Fundus: firm Incision: N/A DVT Evaluation: No cords or calf tenderness. Labs: Lab Results  Component Value Date   WBC 8.3 12/06/2021   HGB 10.8 (L) 12/06/2021   HCT 31.9 (L) 12/06/2021   MCV 86.7 12/06/2021   PLT 222 12/06/2021      Latest Ref Rng & Units 05/05/2010    7:56 AM  CMP  Creatinine 0.4 - 1.2 mg/dL 0.58    Edinburgh Score:    12/08/2021    2:00 AM  Edinburgh Postnatal Depression Scale Screening Tool  I have been able to laugh and see the funny side of things. 0  I have looked forward with enjoyment to things. 0  I have blamed myself unnecessarily when things went wrong. 0  I have been anxious or worried for no good reason. 0  I have felt scared or panicky for no good reason. 0  Things have been getting on top of me. 1  I have been so unhappy that I have had difficulty sleeping. 0  I have felt sad or miserable. 0  I have been so unhappy that I have been crying. 0  The thought of harming myself has occurred to me. 0  EdFlavia Shipperostnatal  Depression Scale Total 1     After visit meds:  Allergies as of 12/08/2021   No Known Allergies      Medication List     STOP taking these medications    Accu-Chek Guide Me w/Device Kit   Accu-Chek Guide test strip Generic drug: glucose blood   Accu-Chek Softclix Lancets lancets   Blood Pressure Kit Devi   ferrous sulfate 325 (65 FE) MG tablet Commonly known as: FerrouSul   Gojji Weight Scale Misc   metFORMIN 500 MG tablet Commonly known as: GLUCOPHAGE       TAKE these medications    acetaminophen 325 MG tablet Commonly known as: Tylenol Take 2 tablets (650 mg total) by mouth every 4 (four) hours as needed (for pain scale < 4).   ibuprofen 600 MG tablet Commonly known as: ADVIL Take 1 tablet (600 mg total) by mouth every 6 (six) hours.    multivitamin-prenatal 27-0.8 MG Tabs tablet Take 1 tablet by mouth daily at 12 noon.         Discharge home in stable condition Infant Feeding: Breast/bottle Infant Disposition: decision pending Discharge instruction: per After Visit Summary and Postpartum booklet. Activity: Advance as tolerated. Pelvic rest for 6 weeks.  Diet: routine diet Future Appointments: Future Appointments  Date Time Provider [place]  01/19/2022  9:15 AM [facility] LAB [facility] None  01/19/2022 10:15 AM Shelly Bombard, MD [facility] None   Follow up Visit:  [place] Follow up.   Why: 6 weeks Contact information: [address] [place] [address] [phone]                Myrtis Ser, CNM  P [facility] Please schedule this patient for Postpartum visit in: 6 weeks with the following provider: Any provider In-Person For C/S patients schedule nurse incision check in weeks 2 weeks: no High risk pregnancy complicated by: GDMA2 Delivery mode:  SVD Anticipated Birth Control:  POPs PP Procedures needed: GTT; Pap Schedule Integrated [place] visit: no   12/08/2021 Desma Maxim, MD

## 2021-12-06 NOTE — Progress Notes (Signed)
Patient ID: Vici Novick, female   DOB: [birth year], 36 y.o.   MRN: [id number]  Pitocin started at 1900 after double cytotec dose (50/25) x 1; feeling the ctx a little stronger; PCN x 2 doses so far  BPs 126/58, 141/69, 130/71 FHR 130-140s, +accels, no decels, variables Ctx q 2-4 mins w Pit @ 45mu/min Cx 4/70/vtx -2; AROM attempt- scant fluid with +bldy show  CBGs: 82, 74  IUP@37 .4wks GDMA2 IOL process GBS+  -Plan to uptitrate Pit to achieve active labor; will continue watching for fluid- not sure if bag was completely broken -Anticipate vag del  Arabella Merles CNM 12/06/2021 9:20 PM

## 2021-12-06 NOTE — Anesthesia Preprocedure Evaluation (Deleted)
Anesthesia Evaluation  Patient identified by MRN, date of birth, ID band Patient awake    Reviewed: Allergy & Precautions, Patient's Chart, lab work & pertinent test results  Airway Mallampati: II       Dental no notable dental hx.    Pulmonary neg pulmonary ROS   Pulmonary exam normal        Cardiovascular negative cardio ROS Normal cardiovascular exam     Neuro/Psych negative neurological ROS  negative psych ROS   GI/Hepatic Neg liver ROS,GERD  ,,  Endo/Other  diabetes, Well Controlled, Gestational, Oral Hypoglycemic Agents  Obesity   Renal/GU negative Renal ROS  negative genitourinary   Musculoskeletal negative musculoskeletal ROS (+)    Abdominal  (+) + obese  Peds  Hematology  (+) Blood dyscrasia, anemia   Anesthesia Other Findings   Reproductive/Obstetrics (+) Pregnancy AMA GDM                             Anesthesia Physical Anesthesia Plan  ASA: 2  Anesthesia Plan: Epidural   Post-op Pain Management:    Induction:   PONV Risk Score and Plan: Treatment may vary due to age or medical condition  Airway Management Planned: Natural Airway  Additional Equipment:   Intra-op Plan:   Post-operative Plan:   Informed Consent: I have reviewed the patients History and Physical, chart, labs and discussed the procedure including the risks, benefits and alternatives for the proposed anesthesia with the patient or authorized representative who has indicated his/her understanding and acceptance.       Plan Discussed with: Anesthesiologist  Anesthesia Plan Comments: (Patient delivered before procedure performed. M. Malen Gauze, MD )        Anesthesia Quick Evaluation

## 2021-12-06 NOTE — Inpatient Diabetes Management (Addendum)
Inpatient Diabetes Program Recommendations  ADA Standards of Care 2021 Diabetes in Pregnancy Target Glucose Ranges:  Fasting: 60 - 90 mg/dL Preprandial: 60 - 092 mg/dL 1 hr postprandial: Less than 140mg /dL (from first bite of meal) 2 hr postprandial: Less than 120 mg/dL (from first bit of meal)    Lab Results  Component Value Date   GLUCAP 134 (H) 05/04/2010   HGBA1C 5.9 (H) 06/21/2021    Review of Glycemic Control  Diabetes history: GDMA2 Outpatient Diabetes medications: Metformin 1000 mg BID Current orders for Inpatient glycemic control: none  Inpatient Diabetes Program Recommendations:    Consult received for evaluate DM management.  Please order CBGs Q4H.    Will continue to follow while inpatient.  Thank you, 06/23/2021, MSN, CDCES Diabetes Coordinator Inpatient Diabetes Program [phone] (team pager from 8a-5p)

## 2021-12-06 NOTE — Lactation Note (Signed)
This note was copied from a baby's chart. Lactation Consultation Note  Patient Name: Gabriela Rivera [id number] Date: 12/06/2021 Reason for consult: L&D Initial assessment;Early term 37-38.6wks Age:38 hours Assisted baby to the breast. Baby latched well. Off and on at first then fed well. Discussed body alignment and STS. It has been 12 yrs since mom last BF a child. Mom stated it feels new again. Refreshed mom's memory on basic BF. Praised mom for BF well. Encouraged mom not to pull back on breast tissue d/t pulls nipple of out baby's mouth. Demonstrated how to hold breast tissue. Will f/u mom on MBU. Mom speaks good [place] and doesn't need interpreter.  Maternal Data Has patient been taught Hand Expression?: Yes Does the patient have breastfeeding experience prior to this delivery?: Yes How long did the patient breastfeed?: 1st child 3 months, 2nd, 3rd child almost 1 yr, 4 th child 4 months he was biting and very aggressive at the breast.  Feeding    LATCH Score Latch: Grasps breast easily, tongue down, lips flanged, rhythmical sucking.  Audible Swallowing: None  Type of Nipple: Everted at rest and after stimulation  Comfort (Breast/Nipple): Soft / non-tender  Hold (Positioning): Assistance needed to correctly position infant at breast and maintain latch.  LATCH Score: 7   Lactation Tools Discussed/Used    Interventions Interventions: Adjust position;Assisted with latch;Skin to skin;Hand express;Breast compression  Discharge    Consult Status Consult Status: Follow-up from L&D Date: 12/07/21 Follow-up type: In-patient    Charyl Dancer 12/06/2021, 10:54 PM

## 2021-12-06 NOTE — H&P (Signed)
OBSTETRIC ADMISSION HISTORY AND PHYSICAL  Gabriela Rivera is a 36 y.o. female [phone] with IUP at 70w4dby LMP presenting for IOL for A2GDM, AMA. She reports +FMs, No LOF, no VB, no blurry vision, headaches or peripheral edema, and RUQ pain.  She plans on breast feeding. She request POPs for birth control. She [facility] --->  Estimated Date of Delivery: 12/23/21  Sono:    _0 , CWD, normal anatomy, Cephalic presentation, Posterior placenta, 2687g, 53% EFW   Prenatal History/Complications: AMA, [id number] Past Medical History: Past Medical History:  Diagnosis Date   Medical history non-contributory     Past Surgical History: Past Surgical History:  Procedure Laterality Date   CHOLECYSTECTOMY  2002    Obstetrical History: OB History     Gravida  5   Para  4   Term  4   Preterm      AB      Living  4      SAB      IAB      Ectopic      Multiple      Live Births  4           Social History Social History   Socioeconomic History   Marital status: Married    Spouse name: Not on file   Number of children: Not on file   Years of education: Not on file   Highest education level: Not on file  Occupational History    Employer: [employer] Tobacco Use   Smoking status: Never   Smokeless tobacco: Never  Vaping Use   Vaping Use: Never used  Substance and Sexual Activity   Alcohol use: No   Drug use: No   Sexual activity: Yes    Partners: Male    Birth control/protection: None  Other Topics Concern   Not on file  Social History Narrative   Not on file   Social Determinants of Health   Financial Resource Strain: Not on file  Food Insecurity: No Food Insecurity (12/06/2021)   Hunger Vital Sign    Worried About Running Out of Food in the Last Year: Never true    Ran Out of Food in the Last Year: Never true  Transportation Needs: No Transportation Needs (12/06/2021)   PRAPARE -  THydrologist(Medical): No    Lack of Transportation (Non-Medical): No  Physical Activity: Not on file  Stress: Not on file  Social Connections: Not on file    Family History: Family History  Problem Relation Age of Onset   Diabetes Mother    Diabetes Father     Allergies: No Known Allergies  Medications Prior to Admission  Medication Sig Dispense Refill Last Dose   Accu-Chek Softclix Lancets lancets Check BS AM Fasting and 2 hours after each meal. 100 each 6 12/06/2021   Blood Glucose Monitoring Suppl (ACCU-CHEK GUIDE ME) w/Device KIT 1 Device by Does not apply route 4 (four) times daily. 1 kit 0 12/06/2021   ferrous sulfate (FERROUSUL) 325 (65 FE) MG tablet Take 1 tablet (325 mg total) by mouth every other day. 60 tablet 3 Past Week   glucose blood (ACCU-CHEK GUIDE) test strip Check BS AM fasting and 2 hours after each meal. 100 each 12 12/06/2021   metFORMIN (GLUCOPHAGE) 500 MG tablet Take 2 tablets (1,000 mg total) by mouth 2 (two) times daily with a  meal. 60 tablet 1 12/06/2021   Prenatal Vit-Fe Fumarate-FA (MULTIVITAMIN-PRENATAL) 27-0.8 MG TABS tablet Take 1 tablet by mouth daily at 12 noon.   12/06/2021   Blood Pressure Monitoring (BLOOD PRESSURE KIT) DEVI 1 kit by Does not apply route once a week. 1 each 0 More than a month   Misc. Devices (GOJJI WEIGHT SCALE) MISC 1 Device by Does not apply route every 30 (thirty) days. 1 each 0 Unknown     Review of Systems   All systems reviewed and negative except as stated in HPI  Blood pressure 135/68, pulse 83, temperature 98.4 F (36.9 C), temperature source Oral, height _0  (1.575 m), weight 87 kg, last menstrual period 03/18/2021. General appearance: alert, cooperative, and appears stated age Lungs: Normal work of breathing  Heart: regular rate  Abdomen: soft, non-tender;  Extremities:no sign of DVT Presentation: cephalic Fetal monitoringBaseline: 150 bpm, Variability: Good {> 6 bpm),  Accelerations: Reactive, and Decelerations: Absent Uterine activityFrequency: occasional  Dilation: 2.5 Effacement (%): Thick Station: -3 Exam by:: Jarret Torre MD   Prenatal labs: ABO, Rh: O/Positive/-- (06/14 1036) Antibody: Negative (06/14 1036) Rubella: <0.90 (06/14 1036) RPR: Non Reactive (09/18 1117)  HBsAg: Negative (06/14 1036)  HIV: Non Reactive (09/18 1117)  GBS: Positive/-- (11/21 1123)  1 hr Glucola abnormal  Genetic screening  LR Anatomy US Normal  Prenatal Transfer Tool  Maternal Diabetes: Yes:  Diabetes Type:  Insulin/Medication controlled Genetic Screening: Normal Maternal Ultrasounds/Referrals: Normal Fetal Ultrasounds or other Referrals:  Referred to Materal Fetal Medicine  Maternal Substance Abuse:  No Significant Maternal Medications:  Meds include: Other: Metformin Significant Maternal Lab Results:  Group B Strep positive Number of Prenatal Visits:greater than 3 verified prenatal visits Other Comments:  None  No results found for this or any previous visit (from the past 24 hour(s)).  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 G5P4004 at 77w4dhere forIOL for GAguadaand AMA.  #Labor:Induction started with cytotec until adequately treated for GBS and then will likely need pitocin  #Pain: Patient would like epidural  #FWB: Cat 1 #ID:  GBS pos PCN #MOF: Breast  #MOC:POP GDMA2: Initial CBG here 72. Q4 CBGs while in latent labor and Q2 in active labor.   JConcepcion Living MD  12/06/2021, 3:03 PM

## 2021-12-06 NOTE — Progress Notes (Signed)
LABOR PROGRESS NOTE  Gabriela Rivera is a 36 y.o. [id number] at [redacted]w[redacted]d  admitted for IOL for A2GDM and AMA.  Subjective: Sitting comfortably in bed.  Objective: BP (!) 141/69 (BP Location: Right Arm)   Pulse 78   Temp 98 F (36.7 C) (Oral)   Resp 14   Ht 5\' 2"  (1.575 m)   Wt 87 kg   LMP 03/18/2021   BMI 35.06 kg/m  or  Vitals:   12/06/21 1556 12/06/21 1701 12/06/21 1756 12/06/21 1835  BP: 133/79 125/64 130/71 (!) 141/69  Pulse: 82 83 84 78  Resp: 16 18 16 14   Temp:    98 F (36.7 C)  TempSrc:    Oral  Weight:      Height:        Dilation: 4 Effacement (%): 70 Station: -2 Presentation: Vertex Exam by:: Lisette Mancebo MD FHT: baseline rate 150, moderate varibility, + acel, no decel Toco: every 2-3 min   Labs: Lab Results  Component Value Date   WBC 8.3 12/06/2021   HGB 10.8 (L) 12/06/2021   HCT 31.9 (L) 12/06/2021   MCV 86.7 12/06/2021   PLT 222 12/06/2021    Patient Active Problem List   Diagnosis Date Noted   Normal labor 12/06/2021   GBS (group B Streptococcus carrier), +RV culture, currently pregnant 11/30/2021   Gestational diabetes 09/26/2021   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 G5P4004 at [redacted]w[redacted]d here for IOL for A2GDM and AMA  Labor: Progressing well. Will initiate pitocin and plan for ROM at next check Fetal Wellbeing:  Cat 1 Pain Control:  per patient  Anticipated MOD:  vaginal   20, MD   12/06/2021, 6:50 PM

## 2021-12-07 ENCOUNTER — Encounter (HOSPITAL_COMMUNITY): Payer: Self-pay | Admitting: Obstetrics and Gynecology

## 2021-12-07 LAB — GLUCOSE, CAPILLARY: Glucose-Capillary: 159 mg/dL — ABNORMAL HIGH (ref 70–99)

## 2021-12-07 LAB — RPR: RPR Ser Ql: NONREACTIVE

## 2021-12-07 MED ORDER — SENNOSIDES-DOCUSATE SODIUM 8.6-50 MG PO TABS
2.0000 | ORAL_TABLET | ORAL | Status: DC
  Administered 2021-12-07 – 2021-12-08 (×2): 2 via ORAL
  Filled 2021-12-07 (×2): qty 2

## 2021-12-07 MED ORDER — DIPHENHYDRAMINE HCL 25 MG PO CAPS: 25.0000 mg | ORAL_CAPSULE | Freq: Four times a day (QID) | ORAL | Status: DC | PRN

## 2021-12-07 MED ORDER — WITCH HAZEL-GLYCERIN EX PADS: 1.0000 | MEDICATED_PAD | CUTANEOUS | Status: DC | PRN

## 2021-12-07 MED ORDER — MEASLES, MUMPS & RUBELLA VAC IJ SOLR: 0.5000 mL | Freq: Once | INTRAMUSCULAR | Status: DC

## 2021-12-07 MED ORDER — IBUPROFEN 600 MG PO TABS
600.0000 mg | ORAL_TABLET | Freq: Four times a day (QID) | ORAL | Status: DC
  Administered 2021-12-07 – 2021-12-08 (×6): 600 mg via ORAL
  Filled 2021-12-07 (×6): qty 1

## 2021-12-07 MED ORDER — TETANUS-DIPHTH-ACELL PERTUSSIS 5-2.5-18.5 LF-MCG/0.5 IM SUSY: 0.5000 mL | PREFILLED_SYRINGE | Freq: Once | INTRAMUSCULAR | Status: DC

## 2021-12-07 MED ORDER — ACETAMINOPHEN 325 MG PO TABS: 650.0000 mg | ORAL_TABLET | ORAL | Status: DC | PRN

## 2021-12-07 MED ORDER — DIBUCAINE (PERIANAL) 1 % EX OINT: 1.0000 | TOPICAL_OINTMENT | CUTANEOUS | Status: DC | PRN

## 2021-12-07 MED ORDER — ZOLPIDEM TARTRATE 5 MG PO TABS: 5.0000 mg | ORAL_TABLET | Freq: Every evening | ORAL | Status: DC | PRN

## 2021-12-07 MED ORDER — COCONUT OIL OIL: 1.0000 | TOPICAL_OIL | Status: DC | PRN

## 2021-12-07 MED ORDER — ONDANSETRON HCL 4 MG/2ML IJ SOLN: 4.0000 mg | INTRAMUSCULAR | Status: DC | PRN

## 2021-12-07 MED ORDER — ONDANSETRON HCL 4 MG PO TABS: 4.0000 mg | ORAL_TABLET | ORAL | Status: DC | PRN

## 2021-12-07 MED ORDER — PRENATAL MULTIVITAMIN CH
1.0000 | ORAL_TABLET | Freq: Every day | ORAL | Status: DC
  Administered 2021-12-07 – 2021-12-08 (×2): 1 via ORAL
  Filled 2021-12-07 (×2): qty 1

## 2021-12-07 MED ORDER — BENZOCAINE-MENTHOL 20-0.5 % EX AERO
1.0000 | INHALATION_SPRAY | CUTANEOUS | Status: DC | PRN
  Filled 2021-12-07: qty 56

## 2021-12-07 MED ORDER — SIMETHICONE 80 MG PO CHEW: 80.0000 mg | CHEWABLE_TABLET | ORAL | Status: DC | PRN

## 2021-12-07 MED ORDER — OXYCODONE HCL 5 MG PO TABS: 5.0000 mg | ORAL_TABLET | ORAL | Status: DC | PRN

## 2021-12-07 NOTE — Lactation Note (Signed)
This note was copied from a baby's chart. Lactation Consultation Note  Patient Name: Gabriela Rivera TOIZT'I Date: 12/07/2021 Reason for consult: Follow-up assessment Age:38 hours, P5, ETI female infant with -2% weight loss. In house Spanish interpreter # Lilly  with [facility] when entering room, Birth Parent declined Spanish Interpreter prefers to speak [place].  Birth Parent feels infant latches well at breast most feedings are 10 to 15 minutes in length. Per Birth Parent, last feeding infant was given 42 mls of formula. LC discussed with Birth Parent to help establish supply latch infant first all feedings 8 x within 24 hours and afterwards supplement infant but decease formula amount and pace feed infant. Birth Parent understands if supplementing infant on day 2 based on infant's birth weight to offer 21-30 mls per feeding to avoid overfeeding infant due infant small tummy size.  Birth Parent knows to call RN/LC if their are any BF questions, concerns or need latch assistance.  Maternal Data    Feeding Mother's Current Feeding Choice: Breast Milk and Formula Nipple Type: Extra Slow Flow  LATCH Score                    Lactation Tools Discussed/Used    Interventions Interventions: Education;Pace feeding  Discharge    Consult Status Consult Status: Follow-up Date: 12/08/21 Follow-up type: In-patient    Frederico Hamman 12/07/2021, 10:28 PM

## 2021-12-07 NOTE — Progress Notes (Signed)
Post Partum Day 1  Subjective: Patient is a [id number] s/p spontaneous vaginal delivery. Patient denies any acute overnight events. She describes her bleeding similar to a light menstrual cycle, no clots. She endorses mild cramping that is well controlled with ibuprofen. She denies nausea, headaches, dizziness, shortness of breath, edema, and dysuria. Patient has not had a bowel movement or passed gas. She is successful with breast feeding.   Objective: Blood pressure (!) 97/52, pulse 77, temperature 98 F (36.7 C), temperature source Oral, resp. rate 18, height 5\' 2"  (1.575 m), weight 87 kg, last menstrual period 03/18/2021, SpO2 98 %, unknown if currently breastfeeding.  Physical Exam:  General: alert, cooperative, and no distress Lochia: appropriate Uterine Fundus: firm Incision: N/A DVT Evaluation: No evidence of DVT seen on physical exam. Negative Homan's sign. No cords or calf tenderness. No significant calf/ankle edema.  Recent Labs    12/06/21 1425  HGB 10.8*  HCT 31.9*    Assessment/Plan: PPD: 1 Contraception: Oral contraception Feeding: breast Disposition: stable, plan for discharge tomorrow    LOS: 1 day   12/08/21, Student-PA 12/07/2021, 7:25 AM

## 2021-12-07 NOTE — Lactation Note (Signed)
This note was copied from a baby's chart. Lactation Consultation Note  Patient Name: Gabriela Rivera OMVEH'M Date: 12/07/2021 Reason for consult: Initial assessment;Early term 37-38.6wks Age:38 hours Mom has BF and formula fed baby.  Newborn feeding habits, behavior, STS, I&O, positioning, support reviewed. Mom wanting to rest. LC placed baby in bassinet. Baby started crying. LC checked diaper and noted stool. LC changed diaper and baby voided while changing diaper. Swaddled then baby went to sleep. Encouraged mom to BF before formula feed. Encouraged mom to call for latch assistance when needed.  Maternal Data    Feeding Nipple Type: Nfant Slow Flow (purple)  LATCH Score Latch: Grasps breast easily, tongue down, lips flanged, rhythmical sucking.  Audible Swallowing: Spontaneous and intermittent  Type of Nipple: Everted at rest and after stimulation  Comfort (Breast/Nipple): Soft / non-tender  Hold (Positioning): No assistance needed to correctly position infant at breast.  LATCH Score: 10   Lactation Tools Discussed/Used    Interventions Interventions: Breast feeding basics reviewed;LC Services brochure;Skin to skin  Discharge    Consult Status Consult Status: Follow-up Date: 12/07/21 Follow-up type: In-patient    Charyl Dancer 12/07/2021, 3:31 AM

## 2021-12-08 LAB — GLUCOSE, CAPILLARY: Glucose-Capillary: 90 mg/dL (ref 70–99)

## 2021-12-08 MED ORDER — ACETAMINOPHEN 325 MG PO TABS: 650.0000 mg | ORAL_TABLET | ORAL | Status: DC | PRN

## 2021-12-08 MED ORDER — IBUPROFEN 600 MG PO TABS: 600.0000 mg | ORAL_TABLET | Freq: Four times a day (QID) | ORAL | 0 refills | Status: DC

## 2021-12-14 ENCOUNTER — Telehealth (HOSPITAL_COMMUNITY): Payer: Self-pay | Admitting: *Deleted

## 2021-12-14 NOTE — Telephone Encounter (Signed)
Left phone voicemail message.  Duffy Rhody, RN 12-14-2021 at 10:17am

## 2021-12-15 ENCOUNTER — Ambulatory Visit: Payer: Medicaid Other

## 2022-01-19 ENCOUNTER — Encounter: Payer: Self-pay | Admitting: Obstetrics

## 2022-01-19 ENCOUNTER — Ambulatory Visit (INDEPENDENT_AMBULATORY_CARE_PROVIDER_SITE_OTHER): Payer: Medicaid Other | Admitting: Obstetrics

## 2022-01-19 ENCOUNTER — Other Ambulatory Visit: Payer: Medicaid Other

## 2022-01-19 DIAGNOSIS — Z3009 Encounter for other general counseling and advice on contraception: Secondary | ICD-10-CM | POA: Diagnosis not present

## 2022-01-19 DIAGNOSIS — Z8632 Personal history of gestational diabetes: Secondary | ICD-10-CM | POA: Diagnosis not present

## 2022-01-19 DIAGNOSIS — Z30011 Encounter for initial prescription of contraceptive pills: Secondary | ICD-10-CM | POA: Diagnosis not present

## 2022-01-19 LAB — POCT URINE PREGNANCY: Preg Test, Ur: NEGATIVE

## 2022-01-19 MED ORDER — NORETHINDRONE 0.35 MG PO TABS: 1.0000 | ORAL_TABLET | Freq: Every day | ORAL | 12 refills | Status: DC

## 2022-01-19 NOTE — Progress Notes (Signed)
Gabriela Rivera Partum Visit Note  Gabriela Rivera is a 37 y.o. [id number] female who presents for a postpartum visit. She is 6 weeks postpartum following a normal spontaneous vaginal delivery.  I have fully reviewed the prenatal and intrapartum course. The delivery was at 5 gestational weeks.  Anesthesia: none. Postpartum course has been unremarkable. Baby is doing well. Baby is feeding by breast. Bleeding no bleeding. Bowel function is normal. Bladder function is normal. Patient is not sexually active. Contraception method is oral progesterone-only contraceptive. Postpartum depression screening: negative.   Upstream - 01/19/22 2423       Pregnancy Intention Screening   Does the patient want to become pregnant in the next year? No    Does the patient's partner want to become pregnant in the next year? No    Would the patient like to discuss contraceptive options today? No      Contraception Wrap Up   Current Method Oral Contraceptive            The pregnancy intention screening data noted above was reviewed. Potential methods of contraception were discussed. The patient elected to proceed with No data recorded.   Edinburgh Postnatal Depression Scale - 01/19/22 0926       Edinburgh Postnatal Depression Scale:  In the Past 7 Days   I have been able to laugh and see the funny side of things. 0    I have looked forward with enjoyment to things. 0    I have blamed myself unnecessarily when things went wrong. 0    I have been anxious or worried for no good reason. 0    I have felt scared or panicky for no good reason. 0    Things have been getting on top of me. 0    I have been so unhappy that I have had difficulty sleeping. 0    I have felt sad or miserable. 0    I have been so unhappy that I have been crying. 0    The thought of harming myself has occurred to me. 0    Edinburgh Postnatal Depression Scale Total 0             Health Maintenance Due  Topic Date Due   COVID-19  Vaccine (1) Never done    The following portions of the patient's history were reviewed and updated as appropriate: allergies, current medications, past family history, past medical history, past social history, past surgical history, and problem list.  Review of Systems A comprehensive review of systems was negative.  Objective:  LMP 03/18/2021    General:  alert and no distress   Breasts:  normal  Lungs: clear to auscultation bilaterally  Heart:  regular rate and rhythm, S1, S2 normal, no murmur, click, rub or gallop  Abdomen: soft, non-tender; bowel sounds normal; no masses,  no organomegaly   Wound none  GU exam:  not indicated       Assessment:   1. Postpartum care following vaginal delivery - doing well  2. History of gestational diabetes mellitus (GDM) Rx: - Glucose tolerance, 2 hours  3. Encounter for other general counseling and advice on contraception - options discussed - wants Depo  4. Encounter for initial prescription of contraceptive pills Rx: - POCT urine pregnancy - norethindrone (MICRONOR) 0.35 MG tablet; Take 1 tablet (0.35 mg total) by mouth daily.  Dispense: 28 tablet; Refill: 12    Plan:   Essential components of care per ACOG recommendations:  1.  Mood and well being: Patient with negative depression screening today. Reviewed local resources for support.  - Patient tobacco use? No.   - hx of drug use? No.    2. Infant care and feeding:  -Patient currently breastmilk feeding? Yes. Discussed returning to work and pumping. Reviewed importance of draining breast regularly to support lactation.  -Social determinants of health (SDOH) reviewed in EPIC. No concerns  3. Sexuality, contraception and birth spacing - Patient does not want a pregnancy in the next year.  Desired family size is 6 children.  - Reviewed reproductive life planning. Reviewed contraceptive methods based on pt preferences and effectiveness.  Patient desired Hormonal Injection  today.   - Discussed birth spacing of 18 months  4. Sleep and fatigue -Encouraged family/partner/community support of 4 hrs of uninterrupted sleep to help with mood and fatigue  5. Physical Recovery  - Discussed patients delivery and complications. She describes her labor as good. - Patient had a Vaginal, no problems at delivery. Patient had  no  laceration. Perineal healing reviewed. Patient expressed understanding - Patient has urinary incontinence? No. - Patient is safe to resume physical and sexual activity  6.  Health Maintenance - HM due items addressed Yes - Last pap smear No results found for: "DIAGPAP" Pap smear not done at today's visit.  -Breast Cancer screening indicated? No.   7. Chronic Disease/Pregnancy Condition follow up: None   Charles A. Franquez for [facility], [place], Merla Riches 01/19/2022

## 2022-01-20 LAB — GLUCOSE TOLERANCE, 2 HOURS
Glucose, 2 hour: 111 mg/dL (ref 70–139)
Glucose, GTT - Fasting: 83 mg/dL (ref 70–99)

## 2023-04-08 ENCOUNTER — Ambulatory Visit

## 2023-04-08 VITALS — BP 129/82 | HR 78

## 2023-04-08 DIAGNOSIS — Z3201 Encounter for pregnancy test, result positive: Secondary | ICD-10-CM

## 2023-04-08 LAB — POCT URINE PREGNANCY: Preg Test, Ur: POSITIVE — AB

## 2023-04-08 MED ORDER — PRENATAL 28-0.8 MG PO TABS: 1.0000 | ORAL_TABLET | Freq: Every day | ORAL | 12 refills | Status: AC

## 2023-04-08 NOTE — Progress Notes (Signed)
 Gabriela Rivera here for a UPT. Pt had a positive upt at home. LMP is 02/23/23.     UPT in office Positive.    Reviewed medications and informed to start a PNV, if not already. Pt to follow up in 3 weeks for New OB Intake.

## 2023-04-23 ENCOUNTER — Inpatient Hospital Stay (HOSPITAL_COMMUNITY)
Admission: AD | Admit: 2023-04-23 | Discharge: 2023-04-23 | Disposition: A | Discharge status: Home / Self Care | Attending: Obstetrics & Gynecology | Admitting: Obstetrics & Gynecology

## 2023-04-23 ENCOUNTER — Inpatient Hospital Stay (HOSPITAL_COMMUNITY)

## 2023-04-23 ENCOUNTER — Encounter (HOSPITAL_COMMUNITY): Payer: Self-pay | Admitting: Obstetrics & Gynecology

## 2023-04-23 ENCOUNTER — Other Ambulatory Visit: Payer: Self-pay

## 2023-04-23 DIAGNOSIS — O26851 Spotting complicating pregnancy, first trimester: Secondary | ICD-10-CM | POA: Insufficient documentation

## 2023-04-23 DIAGNOSIS — O039 Complete or unspecified spontaneous abortion without complication: Secondary | ICD-10-CM

## 2023-04-23 DIAGNOSIS — Z3A08 8 weeks gestation of pregnancy: Secondary | ICD-10-CM | POA: Diagnosis not present

## 2023-04-23 DIAGNOSIS — N939 Abnormal uterine and vaginal bleeding, unspecified: Secondary | ICD-10-CM

## 2023-04-23 LAB — CBC
HCT: 35.1 % — ABNORMAL LOW (ref 36.0–46.0)
Hemoglobin: 11.7 g/dL — ABNORMAL LOW (ref 12.0–15.0)
MCH: 28.5 pg (ref 26.0–34.0)
MCHC: 33.3 g/dL (ref 30.0–36.0)
MCV: 85.6 fL (ref 80.0–100.0)
Platelets: 285 10*3/uL (ref 150–400)
RBC: 4.1 MIL/uL (ref 3.87–5.11)
RDW: 13.7 % (ref 11.5–15.5)
WBC: 5.9 10*3/uL (ref 4.0–10.5)
nRBC: 0 % (ref 0.0–0.2)

## 2023-04-23 LAB — WET PREP, GENITAL
Clue Cells Wet Prep HPF POC: NONE SEEN
Sperm: NONE SEEN
Trich, Wet Prep: NONE SEEN
WBC, Wet Prep HPF POC: <10 (ref <10)
Yeast Wet Prep HPF POC: NONE SEEN

## 2023-04-23 LAB — URINALYSIS, ROUTINE W REFLEX MICROSCOPIC
Bacteria, UA: NONE SEEN
Bilirubin Urine: NEGATIVE
Glucose, UA: NEGATIVE mg/dL
Ketones, ur: NEGATIVE mg/dL
Leukocytes,Ua: NEGATIVE
Nitrite: NEGATIVE
Protein, ur: NEGATIVE mg/dL
RBC / HPF: >50 RBC/hpf (ref 0–5)
Specific Gravity, Urine: 1.018 (ref 1.005–1.030)
pH: 6 (ref 5.0–8.0)

## 2023-04-23 LAB — HCG, QUANTITATIVE, PREGNANCY: hCG, Beta Chain, Quant, S: 13869 m[IU]/mL — ABNORMAL HIGH (ref <5)

## 2023-04-23 NOTE — Discharge Instructions (Signed)
We discussed that miscarriage is common with ~1/4 of women experiencing it in their lifetime. We discussed that there is nothing you did or did not do to cause this. We reviewed most common reason is presumed to be genetic abnormalities that allow a pregnancy to start but not continue past an early stage, but realistically we do not know the cause in most cases. We reviewed that studies show no definite difference between attempting another pregnancy sooner vs waiting, though some studies do show better live birth outcomes with trying sooner. We reviewed options of expectant, medical, or surgical management. After counseling you elected for surgical management. I have sent a message to our scheduler. We discussed you should expect a phone call in the next few days to schedule the procedure.  We discussed return precautions including crescendo abdominal pain, heavy vaginal bleeding soaking >1 pad/hour, and fever.

## 2023-04-23 NOTE — MAU Note (Signed)
 Gabriela Rivera is a 38 y.o. at [redacted]w[redacted]d here in MAU reporting: she had VB with today x1.  With wiping.  Reports last intercourse was yesterday morning.  Denies pain  LMP: 02/23/2023 Onset of complaint: today Pain score: 0 Vitals:   04/23/23 1744  BP: 126/64  Pulse: 66  Resp: 18  Temp: 98 F (36.7 C)  SpO2: 98%     FHT: NA  Lab orders placed from triage: UA

## 2023-04-23 NOTE — MAU Provider Note (Signed)
 VB    S [name] is a 38 y.o. [phone] pregnant female at [redacted]w[redacted]d who presents to MAU today with complaint of VB. Pt states started today and only when wiping.  Coitus 4/14.  Denies Pain.    Pertinent items noted in HPI and remainder of comprehensive ROS otherwise negative.   O BP 126/64 (BP Location: Right Arm)   Pulse 66   Temp 98 F (36.7 C) (Oral)   Resp 18   Ht 5' (1.524 m)   Wt 85.4 kg   LMP 02/23/2023 (Exact Date)   SpO2 98%   BMI 36.77 kg/m  Physical Exam Vitals and nursing note reviewed.  Constitutional:      General: She is not in acute distress.    Appearance: Normal appearance. She is obese. She is not ill-appearing or toxic-appearing.  HENT:     Head: Normocephalic and atraumatic.     Right Ear: External ear normal.     Left Ear: External ear normal.     Nose: Nose normal. No congestion.     Mouth/Throat:     Mouth: Mucous membranes are moist.     Pharynx: Oropharynx is clear.  Eyes:     Extraocular Movements: Extraocular movements intact.     Conjunctiva/sclera: Conjunctivae normal.  Cardiovascular:     Rate and Rhythm: Normal rate.  Pulmonary:     Effort: Pulmonary effort is normal. No respiratory distress.  Abdominal:     General: Abdomen is flat. There is no distension.     Palpations: Abdomen is soft.     Tenderness: There is no abdominal tenderness.  Musculoskeletal:        General: No swelling. Normal range of motion.     Cervical back: Normal range of motion.  Skin:    General: Skin is warm and dry.  Neurological:     Mental Status: She is alert and oriented to person, place, and time. Mental status is at baseline.     Motor: No weakness.     Gait: Gait normal.  Psychiatric:        Mood and Affect: Mood normal.        Behavior: Behavior normal.        Thought Content: Thought content normal.        Judgment: Judgment normal.     MDM: MAU Course: UA noninfectious but with moderate Hgb  Wet Prep negative GC  collected CBC Hgb 11.7, no leukocytosis, Plts 285 ABO O+, no Rhogam needed hCG 13869   [place] = SIUP w/o cardiac activity, suggesting early embryonic demise, small subchorionic hemorrhage occupying 25-50% of GS   Reassured patient that miscarriage is common with ~1/4 of women experiencing it in their lifetime. Reassured patient that there is nothing she did or did not do to cause this. Reviewed most common reason is presumed to be genetic abnormalities that allow a pregnancy to start but not continue past an early stage, but realistically we do not know the cause in most cases. Reviewed that studies show no definite difference between attempting another pregnancy sooner vs waiting, though some studies do show better live birth outcomes with trying sooner. Reviewed options of expectant, medical, or surgical management. After counseling she elected for surgical management. Message sent to scheduler. Advised patient to expect phone call in the next few days. . Blood type --/--/O POS Performed at [facility], [address] [place], [place] [address]  [phone]), rhogam was not indicated. We discussed  return precautions including crescendo abdominal pain, heavy vaginal bleeding soaking >1 pad/hour, and fever.   AP #[redacted] weeks gestation #Vaginal bleeding #Early Embryonic Demise - pt elects surgical management, message to [facility] for scheduling   Discharge from MAU in stable condition with strict/usual precautions Follow up at [facility] as scheduled for ongoing prenatal care  Allergies as of 04/23/2023   No Known Allergies      Medication List     TAKE these medications    acetaminophen 325 MG tablet Commonly known as: Tylenol Take 2 tablets (650 mg total) by mouth every 4 (four) hours as needed (for pain scale < 4).   ibuprofen 600 MG tablet Commonly known as: ADVIL Take 1 tablet (600 mg total) by mouth every 6 (six) hours.   multivitamin-prenatal 27-0.8 MG Tabs tablet Take 1 tablet  by mouth daily at 12 noon.   Prenatal 28-0.8 MG Tabs Take 1 tablet by mouth daily.   norethindrone 0.35 MG tablet Commonly known as: MICRONOR Take 1 tablet (0.35 mg total) by mouth daily.        Ebony Goldstein, MD 04/23/2023 8:57 PM

## 2023-04-24 ENCOUNTER — Encounter

## 2023-04-24 ENCOUNTER — Other Ambulatory Visit: Payer: Self-pay | Admitting: Obstetrics & Gynecology

## 2023-04-24 ENCOUNTER — Encounter (HOSPITAL_COMMUNITY): Payer: Self-pay | Admitting: Obstetrics & Gynecology

## 2023-04-24 ENCOUNTER — Other Ambulatory Visit: Payer: Self-pay

## 2023-04-24 LAB — GC/CHLAMYDIA PROBE AMP (~~LOC~~) NOT AT ARMC
Chlamydia: NEGATIVE
Comment: NEGATIVE
Comment: NORMAL
Neisseria Gonorrhea: NEGATIVE

## 2023-04-24 LAB — ABO/RH: ABO/RH(D): O POS

## 2023-04-24 NOTE — Progress Notes (Signed)
 PCP - none Cardiologist - none  Chest x-ray - n/a EKG - n/a Stress Test - n/a ECHO - n/a Cardiac Cath - n/a  ICD Pacemaker/Loop - n/a  Sleep Study -  n/a  Diabetes - n/a  Aspirin & Blood Thinner Instructions:  n/a  NPO  Anesthesia review: no  STOP now taking any Aspirin (unless otherwise instructed by your surgeon), Aleve, Naproxen, Ibuprofen, Motrin, Advil, Goody's, BC's, all herbal medications, fish oil, and all vitamins.   Coronavirus Screening Do you have any of the following symptoms:  Cough yes/no: No Fever (>100.28F)  yes/no: No Runny nose yes/no: No Sore throat yes/no: No Difficulty breathing/shortness of breath  yes/no: No  Have you traveled in the last 14 days and where? yes/no: No  Patient verbalized understanding of instructions that were given via phone via Spanish Interpreter ID # [id number]

## 2023-04-24 NOTE — Anesthesia Preprocedure Evaluation (Signed)
 Anesthesia Evaluation  Patient identified by MRN, date of birth, ID band Patient awake    Reviewed: Allergy & Precautions, NPO status , Patient's Chart, lab work & pertinent test results  History of Anesthesia Complications Negative for: history of anesthetic complications  Airway Mallampati: III  TM Distance: >3 FB Neck ROM: Full    Dental  (+) Dental Advisory Given Top and bottom braces:   Pulmonary neg pulmonary ROS   Pulmonary exam normal breath sounds clear to auscultation       Cardiovascular negative cardio ROS  Rhythm:Regular Rate:Normal     Neuro/Psych negative neurological ROS     GI/Hepatic negative GI ROS, Neg liver ROS,,,  Endo/Other  negative endocrine ROS    Renal/GU negative Renal ROS     Musculoskeletal   Abdominal  (+) + obese  Peds  Hematology negative hematology ROS (+) Lab Results      Component                Value               Date                      WBC                      5.9                 04/23/2023                HGB                      11.7 (L)            04/23/2023                HCT                      35.1 (L)            04/23/2023                MCV                      85.6                04/23/2023                PLT                      285                 04/23/2023              Anesthesia Other Findings   Reproductive/Obstetrics Missed abortion                             Anesthesia Physical Anesthesia Plan  ASA: 2  Anesthesia Plan: General   Post-op Pain Management: Tylenol PO (pre-op)*   Induction: Intravenous  PONV Risk Score and Plan: 3 and Ondansetron, Dexamethasone and Treatment may vary due to age or medical condition  Airway Management Planned: LMA  Additional Equipment:   Intra-op Plan:   Post-operative Plan: Extubation in OR  Informed Consent: I have reviewed the patients History and Physical, chart, labs and  discussed the procedure including the risks, benefits and alternatives for the proposed anesthesia with  the patient or authorized representative who has indicated his/her understanding and acceptance.     Dental advisory given  Plan Discussed with: CRNA and Anesthesiologist  Anesthesia Plan Comments: (Risks of general anesthesia discussed including, but not limited to, sore throat, hoarse voice, chipped/damaged teeth, injury to vocal cords, nausea and vomiting, allergic reactions, lung infection, heart attack, stroke, and death. All questions answered. )       Anesthesia Quick Evaluation

## 2023-04-25 ENCOUNTER — Ambulatory Visit (HOSPITAL_COMMUNITY)
Admission: RE | Admit: 2023-04-25 | Discharge: 2023-04-25 | Disposition: A | Discharge status: Home / Self Care | Source: Ambulatory Visit | Attending: Obstetrics & Gynecology | Admitting: Obstetrics & Gynecology

## 2023-04-25 ENCOUNTER — Ambulatory Visit (HOSPITAL_COMMUNITY): Admitting: Anesthesiology

## 2023-04-25 ENCOUNTER — Encounter (HOSPITAL_COMMUNITY)
Admission: RE | Disposition: A | Discharge status: Home / Self Care | Payer: Self-pay | Source: Ambulatory Visit | Attending: Obstetrics & Gynecology

## 2023-04-25 ENCOUNTER — Other Ambulatory Visit: Payer: Self-pay

## 2023-04-25 ENCOUNTER — Encounter (HOSPITAL_COMMUNITY): Payer: Self-pay | Admitting: Obstetrics & Gynecology

## 2023-04-25 ENCOUNTER — Ambulatory Visit (HOSPITAL_BASED_OUTPATIENT_CLINIC_OR_DEPARTMENT_OTHER): Admitting: Anesthesiology

## 2023-04-25 DIAGNOSIS — O021 Missed abortion: Secondary | ICD-10-CM | POA: Diagnosis present

## 2023-04-25 DIAGNOSIS — Z3A08 8 weeks gestation of pregnancy: Secondary | ICD-10-CM | POA: Diagnosis not present

## 2023-04-25 DIAGNOSIS — Z3A01 Less than 8 weeks gestation of pregnancy: Secondary | ICD-10-CM | POA: Insufficient documentation

## 2023-04-25 HISTORY — PX: DILATION AND EVACUATION: SHX1459

## 2023-04-25 LAB — TYPE AND SCREEN
ABO/RH(D): O POS
Antibody Screen: NEGATIVE

## 2023-04-25 SURGERY — DILATION AND EVACUATION, UTERUS
Anesthesia: General

## 2023-04-25 MED ORDER — LACTATED RINGERS IV SOLN: INTRAVENOUS | Status: DC

## 2023-04-25 MED ORDER — ONDANSETRON HCL 4 MG/2ML IJ SOLN
INTRAMUSCULAR | Status: DC | PRN
  Administered 2023-04-25: 4 mg via INTRAVENOUS

## 2023-04-25 MED ORDER — FENTANYL CITRATE (PF) 250 MCG/5ML IJ SOLN
INTRAMUSCULAR | Status: AC
  Filled 2023-04-25: qty 5

## 2023-04-25 MED ORDER — FENTANYL CITRATE (PF) 250 MCG/5ML IJ SOLN
INTRAMUSCULAR | Status: DC | PRN
  Administered 2023-04-25 (×2): 50 ug via INTRAVENOUS

## 2023-04-25 MED ORDER — MIDAZOLAM HCL 2 MG/2ML IJ SOLN
INTRAMUSCULAR | Status: DC | PRN
  Administered 2023-04-25: 2 mg via INTRAVENOUS

## 2023-04-25 MED ORDER — BUPIVACAINE HCL (PF) 0.5 % IJ SOLN
INTRAMUSCULAR | Status: DC | PRN
  Administered 2023-04-25: 7 mL

## 2023-04-25 MED ORDER — CHLORHEXIDINE GLUCONATE 0.12 % MT SOLN
OROMUCOSAL | Status: AC
  Administered 2023-04-25: 15 mL via OROMUCOSAL
  Filled 2023-04-25: qty 15

## 2023-04-25 MED ORDER — PHENYLEPHRINE 80 MCG/ML (10ML) SYRINGE FOR IV PUSH (FOR BLOOD PRESSURE SUPPORT)
PREFILLED_SYRINGE | INTRAVENOUS | Status: AC
  Filled 2023-04-25: qty 10

## 2023-04-25 MED ORDER — OXYCODONE HCL 5 MG PO TABS: 5.0000 mg | ORAL_TABLET | Freq: Four times a day (QID) | ORAL | 0 refills | Status: AC | PRN

## 2023-04-25 MED ORDER — BUPIVACAINE HCL (PF) 0.5 % IJ SOLN
INTRAMUSCULAR | Status: AC
  Filled 2023-04-25: qty 30

## 2023-04-25 MED ORDER — FENTANYL CITRATE (PF) 100 MCG/2ML IJ SOLN: 25.0000 ug | INTRAMUSCULAR | Status: DC | PRN

## 2023-04-25 MED ORDER — POVIDONE-IODINE 10 % EX SWAB
2.0000 | Freq: Once | CUTANEOUS | Status: AC
  Administered 2023-04-25: 2 via TOPICAL

## 2023-04-25 MED ORDER — ORAL CARE MOUTH RINSE: 15.0000 mL | Freq: Once | OROMUCOSAL | Status: AC

## 2023-04-25 MED ORDER — OXYCODONE HCL 5 MG/5ML PO SOLN: 5.0000 mg | Freq: Once | ORAL | Status: DC | PRN

## 2023-04-25 MED ORDER — CHLORHEXIDINE GLUCONATE 0.12 % MT SOLN: 15.0000 mL | Freq: Once | OROMUCOSAL | Status: AC

## 2023-04-25 MED ORDER — DOXYCYCLINE HYCLATE 100 MG IV SOLR
200.0000 mg | INTRAVENOUS | Status: AC
  Administered 2023-04-25: 200 mg via INTRAVENOUS
  Filled 2023-04-25: qty 200

## 2023-04-25 MED ORDER — KETOROLAC TROMETHAMINE 30 MG/ML IJ SOLN
INTRAMUSCULAR | Status: DC | PRN
  Administered 2023-04-25: 30 mg via INTRAVENOUS

## 2023-04-25 MED ORDER — PROMETHAZINE (PHENERGAN) 6.25MG IN NS 50ML IVPB: 6.2500 mg | INTRAVENOUS | Status: DC | PRN

## 2023-04-25 MED ORDER — MIDAZOLAM HCL 2 MG/2ML IJ SOLN
INTRAMUSCULAR | Status: AC
  Filled 2023-04-25: qty 2

## 2023-04-25 MED ORDER — OXYCODONE HCL 5 MG PO TABS: 5.0000 mg | ORAL_TABLET | Freq: Once | ORAL | Status: DC | PRN

## 2023-04-25 MED ORDER — 0.9 % SODIUM CHLORIDE (POUR BTL) OPTIME
TOPICAL | Status: DC | PRN
  Administered 2023-04-25: 1000 mL

## 2023-04-25 MED ORDER — ACETAMINOPHEN 500 MG PO TABS: 1000.0000 mg | ORAL_TABLET | Freq: Once | ORAL | Status: AC

## 2023-04-25 MED ORDER — LIDOCAINE 2% (20 MG/ML) 5 ML SYRINGE
INTRAMUSCULAR | Status: DC | PRN
  Administered 2023-04-25: 100 mg via INTRAVENOUS

## 2023-04-25 MED ORDER — KETOROLAC TROMETHAMINE 30 MG/ML IJ SOLN
INTRAMUSCULAR | Status: AC
  Filled 2023-04-25: qty 1

## 2023-04-25 MED ORDER — IBUPROFEN 600 MG PO TABS: 600.0000 mg | ORAL_TABLET | Freq: Four times a day (QID) | ORAL | 3 refills | Status: AC | PRN

## 2023-04-25 MED ORDER — PROPOFOL 10 MG/ML IV BOLUS
INTRAVENOUS | Status: DC | PRN
Start: 2023-04-25 — End: 2023-04-25
  Administered 2023-04-25: 200 mg via INTRAVENOUS

## 2023-04-25 MED ORDER — ACETAMINOPHEN 500 MG PO TABS
ORAL_TABLET | ORAL | Status: AC
  Administered 2023-04-25: 1000 mg via ORAL
  Filled 2023-04-25: qty 2

## 2023-04-25 MED ORDER — DEXAMETHASONE SODIUM PHOSPHATE 10 MG/ML IJ SOLN
INTRAMUSCULAR | Status: DC | PRN
  Administered 2023-04-25: 10 mg via INTRAVENOUS

## 2023-04-25 MED ORDER — PHENYLEPHRINE 80 MCG/ML (10ML) SYRINGE FOR IV PUSH (FOR BLOOD PRESSURE SUPPORT)
PREFILLED_SYRINGE | INTRAVENOUS | Status: DC | PRN
  Administered 2023-04-25 (×2): 160 ug via INTRAVENOUS
  Administered 2023-04-25: 80 ug via INTRAVENOUS
  Administered 2023-04-25: 160 ug via INTRAVENOUS

## 2023-04-25 MED ORDER — PROPOFOL 10 MG/ML IV BOLUS
INTRAVENOUS | Status: AC
  Filled 2023-04-25: qty 20

## 2023-04-25 SURGICAL SUPPLY — 19 items
CATH ROBINSON RED A/P 16FR (CATHETERS) ×1 IMPLANT
FILTER UTR ASPR ASSEMBLY (MISCELLANEOUS) ×1 IMPLANT
GLOVE BIO SURGEON STRL SZ 6.5 (GLOVE) ×1 IMPLANT
GLOVE BIOGEL PI IND STRL 7.0 (GLOVE) ×2 IMPLANT
GOWN STRL REUS W/ TWL LRG LVL3 (GOWN DISPOSABLE) ×2 IMPLANT
HOSE CONNECTING 18IN BERKELEY (TUBING) ×1 IMPLANT
KIT BERKELEY 1ST TRI 3/8 NO TR (MISCELLANEOUS) ×1 IMPLANT
KIT BERKELEY 1ST TRIMESTER 3/8 (MISCELLANEOUS) ×1 IMPLANT
NS IRRIG 1000ML POUR BTL (IV SOLUTION) ×1 IMPLANT
PACK VAGINAL MINOR WOMEN LF (CUSTOM PROCEDURE TRAY) ×1 IMPLANT
PAD OB MATERNITY 11 LF (PERSONAL CARE ITEMS) ×1 IMPLANT
SET BERKELEY SUCTION TUBING (SUCTIONS) IMPLANT
SPIKE FLUID TRANSFER (MISCELLANEOUS) IMPLANT
TOWEL GREEN STERILE FF (TOWEL DISPOSABLE) ×2 IMPLANT
UNDERPAD 30X36 HEAVY ABSORB (UNDERPADS AND DIAPERS) ×1 IMPLANT
VACURETTE 10 RIGID CVD (CANNULA) IMPLANT
VACURETTE 7MM CVD STRL WRAP (CANNULA) IMPLANT
VACURETTE 8 RIGID CVD (CANNULA) IMPLANT
VACURETTE 9 RIGID CVD (CANNULA) IMPLANT

## 2023-04-25 NOTE — Anesthesia Postprocedure Evaluation (Signed)
 Anesthesia Post Note  Patient: Gabriela Rivera  Procedure(s) Performed: DILATION AND EVACUATION, UTERUS     Patient location during evaluation: PACU Anesthesia Type: General Level of consciousness: awake Pain management: pain level controlled Vital Signs Assessment: post-procedure vital signs reviewed and stable Respiratory status: spontaneous breathing, nonlabored ventilation and respiratory function stable Cardiovascular status: blood pressure returned to baseline and stable Postop Assessment: no apparent nausea or vomiting Anesthetic complications: no   No notable events documented.  Last Vitals:  Vitals:   04/25/23 1100 04/25/23 1115  BP: 111/69   Pulse: 65 65  Resp: 12 13  Temp:    SpO2: 96% 98%    Last Pain:  Vitals:   04/25/23 1100  TempSrc:   PainSc: 0-No pain                 Conard Decent

## 2023-04-25 NOTE — Anesthesia Procedure Notes (Signed)
 Procedure Name: LMA Insertion Date/Time: 04/25/2023 10:04 AM  Performed by: Johann Muta, CRNAPre-anesthesia Checklist: Patient identified, Emergency Drugs available, Suction available and Patient being monitored Patient Re-evaluated:Patient Re-evaluated prior to induction Oxygen Delivery Method: Circle System Utilized Preoxygenation: Pre-oxygenation with 100% oxygen Induction Type: IV induction Ventilation: Mask ventilation without difficulty LMA: LMA inserted LMA Size: 4.0 Number of attempts: 1 Airway Equipment and Method: Bite block Placement Confirmation: positive ETCO2 Tube secured with: Tape Dental Injury: Teeth and Oropharynx as per pre-operative assessment

## 2023-04-25 NOTE — H&P (Signed)
 Gabriela Rivera is an 38 y.o. female. [id number] [redacted]w[redacted]d Patient was diagnosed with embryonic demise 4/15 and is scheduled for suction D&E today  Pertinent Gynecological History:  Bleeding: none  Menstrual History:  Patient's last menstrual period was 02/23/2023 (exact date).    Past Medical History:  Diagnosis Date   Medical history non-contributory     Past Surgical History:  Procedure Laterality Date   CHOLECYSTECTOMY  2002    Family History  Problem Relation Age of Onset   Diabetes Mother    Diabetes Father     Social History:  reports that she has never smoked. She has never used smokeless tobacco. She reports that she does not drink alcohol and does not use drugs.  Allergies: No Known Allergies  Medications Prior to Admission  Medication Sig Dispense Refill Last Dose/Taking   acetaminophen (TYLENOL) 325 MG tablet Take 2 tablets (650 mg total) by mouth every 4 (four) hours as needed (for pain scale < 4). (Patient not taking: Reported on 04/08/2023)      ibuprofen (ADVIL) 600 MG tablet Take 1 tablet (600 mg total) by mouth every 6 (six) hours. (Patient not taking: Reported on 04/08/2023) 30 tablet 0    norethindrone (MICRONOR) 0.35 MG tablet Take 1 tablet (0.35 mg total) by mouth daily. (Patient not taking: Reported on 04/08/2023) 28 tablet 12    Prenatal 28-0.8 MG TABS Take 1 tablet by mouth daily. 30 tablet 12 04/20/2023   Prenatal Vit-Fe Fumarate-FA (MULTIVITAMIN-PRENATAL) 27-0.8 MG TABS tablet Take 1 tablet by mouth daily at 12 noon.   04/20/2023    Review of Systems  Constitutional: Negative.   Respiratory: Negative.    Cardiovascular: Negative.   Gastrointestinal: Negative.   Genitourinary:  Negative for menstrual problem, pelvic pain, vaginal bleeding and vaginal discharge.    Blood pressure (!) 144/78, pulse 77, temperature 97.9 F (36.6 C), temperature source Oral, resp. rate 15, height 5' (1.524 m), weight 85.3 kg, last menstrual period 02/23/2023, SpO2 98%,  not currently breastfeeding. Physical Exam Vitals and nursing note reviewed.  Constitutional:      Appearance: Normal appearance.  HENT:     Head: Normocephalic and atraumatic.  Cardiovascular:     Rate and Rhythm: Normal rate.  Pulmonary:     Effort: Pulmonary effort is normal.  Abdominal:     General: Abdomen is flat.  Neurological:     Mental Status: She is alert.  Psychiatric:        Mood and Affect: Mood normal.        Behavior: Behavior normal.     Results for orders placed or performed during the hospital encounter of 04/25/23 (from the past 24 hours)  Type and screen [facility]     Status: None   Collection Time: 04/25/23  7:45 AM  Result Value Ref Range   ABO/RH(D) O POS    Antibody Screen NEG    Sample Expiration      04/28/2023,2359 Performed at [facility], [address], [place], [place] [address]     US OB LESS THAN 14 WEEKS WITH OB TRANSVAGINAL Result Date: 04/23/2023 CLINICAL DATA:  [id number] Vaginal spotting [id number] EXAM: OBSTETRIC <14 WK [place] AND TRANSVAGINAL OB US TECHNIQUE: Both transabdominal and transvaginal ultrasound examinations were performed for complete evaluation of the gestation as well as the maternal uterus, adnexal regions, and pelvic cul-de-sac. Transvaginal technique was performed to assess early pregnancy. COMPARISON:  None Available. FINDINGS: Intrauterine gestational sac: Single Yolk sac:  Visualized.  Embryo:  Visualized. Cardiac Activity: Not Visualized. CRL:  15.4 mm   7 w   6 d                  US  EDC: 12/04/2023. Subchorionic hemorrhage: Small acute subchorionic hemorrhage noted measuring 0.8 x 2.5 cm occupying 25-50% of the circumference of the gestational sac. Maternal uterus/adnexae: Right ovary is within normal limits. Left ovary is not visualized. IMPRESSION: *Single intrauterine pregnancy with absence of cardiac activity, suggesting early embryonic demise. Electronically Signed   By: Beula Brunswick M.D.   On:  04/23/2023 18:57    Assessment/Plan: Fetal demise, for D&E. The risks of surgery were discussed in detail with the patient including but not limited to: bleeding which may require transfusion or reoperation; infection which may require prolonged hospitalization or re-hospitalization and antibiotic therapy; injury to bowel, bladder, ureters and major vessels or other surrounding organs which may lead to other procedures; formation of adhesions; need for additional procedures including laparotomy or subsequent procedures secondary to intraoperative injury or abnormal pathology; thromboembolic phenomenon; incisional problems and other postoperative or anesthesia complications.  Patient was told that the likelihood that her condition and symptoms will be treated effectively with this surgical management was very high; the postoperative expectations were also discussed in detail. The patient also understands the alternative treatment options which were discussed in full. All questions were answered. Interpreter present for the interview.  Gabriela Rivera 04/25/2023, 9:28 AM

## 2023-04-25 NOTE — Transfer of Care (Signed)
 Immediate Anesthesia Transfer of Care Note  Patient: Gabriela Rivera  Procedure(s) Performed: DILATION AND EVACUATION, UTERUS  Patient Location: PACU  Anesthesia Type:General  Level of Consciousness: awake and alert   Airway & Oxygen Therapy: Patient Spontanous Breathing and Patient connected to nasal cannula oxygen  Post-op Assessment: Report given to RN and Post -op Vital signs reviewed and stable  Post vital signs: Reviewed and stable  Last Vitals:  Vitals Value Taken Time  BP 112/66 04/25/23 1045  Temp    Pulse 64 04/25/23 1045  Resp 12 04/25/23 1045  SpO2 98 % 04/25/23 1045  Vitals shown include unfiled device data.  Last Pain:  Vitals:   04/25/23 0728  TempSrc:   PainSc: 0-No pain         Complications: No notable events documented.

## 2023-04-25 NOTE — Op Note (Signed)
 Gabriela Rivera PROCEDURE DATE: 04/25/2023  PREOPERATIVE DIAGNOSIS: 7.5 week missed abortion POSTOPERATIVE DIAGNOSIS: The same PROCEDURE:     Dilation and Evacuation SURGEON:  [name] MD  INDICATIONS: 38 y.o. [id number] with MAB at 7.[redacted] weeks gestation, needing surgical completion.  Risks of surgery were discussed with the patient including but not limited to: bleeding which may require transfusion; infection which may require antibiotics; injury to uterus or surrounding organs; need for additional procedures including laparotomy or laparoscopy; possibility of intrauterine scarring which may impair future fertility; and other postoperative/anesthesia complications. Written informed consent was obtained.    FINDINGS:  A 8 week size uterus, moderate amounts of products of conception, specimen sent to pathology.  ANESTHESIA:    Monitored intravenous sedation, paracervical block. INTRAVENOUS FLUIDS:  500 ml of LR ESTIMATED BLOOD LOSS:  Less than 20 ml. SPECIMENS:  Products of conception sent to pathology COMPLICATIONS:  None immediate.  PROCEDURE DETAILS:  The patient received intravenous Doxycycline while in the preoperative area.  She was then taken to the operating room where monitored intravenous sedation was administered and was found to be adequate.  After an adequate timeout was performed, she was placed in the dorsal lithotomy position and examined; then prepped and draped in the sterile manner.   Her bladder was catheterized for an unmeasured amount of clear, yellow urine. A vaginal speculum was then placed in the patient's vagina and a single tooth tenaculum was applied to the anterior lip of the cervix.  A paracervical block using 6 ml of 0.5% Marcaine was administered. The cervix was gently dilated to accommodate a 9 mm suction curette that was gently advanced to the uterine fundus.  The suction device was then activated and curette slowly rotated to clear the uterus of products of  conception and complete emptying of the uterus was confirmed. There was minimal bleeding noted and the tenaculum removed with good hemostasis noted.   All instruments were removed from the patient's vagina.  Sponge and instrument counts were correct times two  The patient tolerated the procedure well and was taken to the recovery area awake, and in stable condition.  Tresia Fruit, MD 04/25/2023 10:34 AM

## 2023-04-26 ENCOUNTER — Encounter (HOSPITAL_COMMUNITY): Payer: Self-pay | Admitting: Obstetrics & Gynecology

## 2023-04-29 LAB — SURGICAL PATHOLOGY

## 2023-05-08 ENCOUNTER — Ambulatory Visit: Admitting: Obstetrics and Gynecology

## 2023-05-08 ENCOUNTER — Encounter: Payer: Self-pay | Admitting: Obstetrics and Gynecology

## 2023-05-08 VITALS — BP 104/68 | HR 69 | Wt 186.0 lb

## 2023-05-08 DIAGNOSIS — Z3009 Encounter for other general counseling and advice on contraception: Secondary | ICD-10-CM

## 2023-05-08 DIAGNOSIS — Z4889 Encounter for other specified surgical aftercare: Secondary | ICD-10-CM

## 2023-05-08 MED ORDER — NORETHIN ACE-ETH ESTRAD-FE 1-20 MG-MCG(24) PO TABS: 1.0000 | ORAL_TABLET | Freq: Every day | ORAL | 11 refills | Status: AC

## 2023-05-08 NOTE — Progress Notes (Signed)
 Pt is here for post op appt, pt states she is doing well.  Pt is interested in [facility] pills.

## 2023-05-08 NOTE — Progress Notes (Signed)
    Subjective:    Gabriela Rivera is a 38 y.o. female who presents to the clinic status post suction on 04/25/23. The patient is not having any pain.  Eating a regular diet without difficulty. Bowel movements are normal. No other significant postoperative concerns.  The following portions of the patient's history were reviewed and updated as appropriate: allergies, current medications, past family history, past medical history, past social history, past surgical history, and problem list..  Last pap smear was not recorded.  Review of Systems Pertinent items are noted in HPI.   Objective:   BP 104/68   Pulse 69   Wt 186 lb (84.4 kg)   LMP 02/23/2023 (Exact Date)   Breastfeeding Unknown   BMI 36.33 kg/m  Constitutional:  Well-developed, well-nourished female in no acute distress.   Skin: Skin is warm and dry, no rash noted, not diaphoretic,no erythema, no pallor.  Cardiovascular: Normal heart rate noted  Respiratory: Effort and breath sounds normal, no problems with respiration noted  Abdomen: Soft, bowel sounds active, non-tender, no abnormal masses  Incision: Healing well, no drainage, no erythema, no hernia, no seroma, no swelling, no dehiscence, incision well approximated  Pelvic:   Deferred   Surgical pathology () Products of conception noted No ANORA performed Assessment:   Doing well postoperatively.  Operative findings again reviewed. Pathology report discussed.   Plan:   1. Continue any current medications. 2. Wound care discussed. 3. Activity restrictions: none 4. Anticipated return to work: now. 5. Follow up as needed 6.  Routine preventative health maintenance measures emphasized. Please refer to After Visit Summary for other counseling recommendations.   Pt desires rx for OCP, she will discuss with spouse before initiation Rx for loestrin 24 sent Avie Boeck, MD, FACOG Attending Obstetrician & Gynecologist Center for [facility], [facility]

## 2023-08-22 ENCOUNTER — Ambulatory Visit (INDEPENDENT_AMBULATORY_CARE_PROVIDER_SITE_OTHER)

## 2023-08-22 VITALS — BP 133/79 | HR 76 | Wt 194.0 lb

## 2023-08-22 DIAGNOSIS — Z3200 Encounter for pregnancy test, result unknown: Secondary | ICD-10-CM

## 2023-08-22 DIAGNOSIS — Z3201 Encounter for pregnancy test, result positive: Secondary | ICD-10-CM | POA: Diagnosis not present

## 2023-08-22 LAB — POCT URINE PREGNANCY: Preg Test, Ur: POSITIVE — AB

## 2023-08-22 NOTE — Progress Notes (Signed)
..  [name] presents today for UPT. She has no unusual complaints. LMP:07/13/23   OBJECTIVE: Appears well, in no apparent distress.  OB History     Gravida  7   Para  5   Term  5   Preterm      AB  1   Living  5      SAB  1   IAB      Ectopic      Multiple  0   Live Births  5          Home UPT Result:Positive In-Office UPT result:Positive I have reviewed the patient's medical, obstetrical, social, and family histories, and medications.   ASSESSMENT: Positive pregnancy test  PLAN Prenatal care to be completed at: [facility]

## 2023-09-05 ENCOUNTER — Telehealth: Payer: Self-pay

## 2023-09-05 NOTE — Telephone Encounter (Signed)
 Return TC to pt about pink spotting. Pt has intake scheduled for Tuesday. Informed pt that pink spotting can be normal in early pregnancy. Pt reports no pain with the spotting, only notices it occasionally. Has not noticed an increase, and has not had any bright red bleeding. Informed pt of recommendations and if she sees an increase in bleeding or if it becomes bright red, she should report to [facility].

## 2023-09-10 ENCOUNTER — Ambulatory Visit (INDEPENDENT_AMBULATORY_CARE_PROVIDER_SITE_OTHER): Admitting: *Deleted

## 2023-09-10 ENCOUNTER — Other Ambulatory Visit (INDEPENDENT_AMBULATORY_CARE_PROVIDER_SITE_OTHER): Payer: Self-pay

## 2023-09-10 VITALS — BP 143/84 | HR 71

## 2023-09-10 DIAGNOSIS — O3680X0 Pregnancy with inconclusive fetal viability, not applicable or unspecified: Secondary | ICD-10-CM | POA: Diagnosis not present

## 2023-09-10 DIAGNOSIS — O0990 Supervision of high risk pregnancy, unspecified, unspecified trimester: Secondary | ICD-10-CM

## 2023-09-10 DIAGNOSIS — Z3A01 Less than 8 weeks gestation of pregnancy: Secondary | ICD-10-CM | POA: Diagnosis not present

## 2023-09-10 DIAGNOSIS — Z3A08 8 weeks gestation of pregnancy: Secondary | ICD-10-CM

## 2023-09-10 DIAGNOSIS — Z1332 Encounter for screening for maternal depression: Secondary | ICD-10-CM

## 2023-09-10 MED ORDER — PREPLUS 27-1 MG PO TABS: 1.0000 | ORAL_TABLET | Freq: Every day | ORAL | 13 refills | Status: AC

## 2023-09-10 MED ORDER — BLOOD PRESSURE KIT DEVI: 1.0000 | 0 refills | Status: AC

## 2023-09-10 NOTE — Patient Instructions (Signed)
[facility] has a partnership with the [employer] to provide prenatal navigation for the most needed resources in our community. In order to see how we can help connect you to these resources we need consent to contact you. Please complete the very short consent using the link below:   English Link: [email or web address]  Spanish Link: [email or web address]  

## 2023-09-10 NOTE — Progress Notes (Signed)
 OB limited US  today shows intrauterine GS but no YS or FP. Pt reports vaginal bleeding for several days. GA by LMP is [redacted]w[redacted]d. GS=[redacted]w[redacted]d. Bright red blood noted on vaginal US  probe. Dr. Zina was consulted. Plan to f/u in the office for repeat US  in 2 weeks. If pregnancy has resolved at that time, no further f/u. If POC still noted on US  at that time, will offer cytotec .

## 2023-09-24 ENCOUNTER — Ambulatory Visit: Admitting: *Deleted

## 2023-09-24 ENCOUNTER — Other Ambulatory Visit (INDEPENDENT_AMBULATORY_CARE_PROVIDER_SITE_OTHER): Payer: Self-pay

## 2023-09-24 VITALS — BP 140/86 | HR 72 | Wt 188.6 lb

## 2023-09-24 DIAGNOSIS — O021 Missed abortion: Secondary | ICD-10-CM | POA: Diagnosis not present

## 2023-09-24 DIAGNOSIS — Z3A01 Less than 8 weeks gestation of pregnancy: Secondary | ICD-10-CM | POA: Diagnosis not present

## 2023-09-24 DIAGNOSIS — O3680X0 Pregnancy with inconclusive fetal viability, not applicable or unspecified: Secondary | ICD-10-CM

## 2023-09-24 NOTE — Progress Notes (Signed)
 Gabriela Rivera presents for follow US  for suspected SAB. Pt was seen for US / Intake on 09/10/23 and US  showed GS only. No FP or YS visualized. Pt had VB on that day and for several days before. In the 2 week interval since that scan pt reports 8 days of VB like a period and she is not bleeding now. No GS, YS, or FP was seen on today's scan. Images reviewed by Dr. Abigail. Advised follow up SAB appt in 2 weeks. The patient is due for a Pap/ Annual exam. These will be scheduled together. Emotional support and condolences offered to patient.

## 2023-09-24 NOTE — Patient Instructions (Signed)
 Miscarriage A miscarriage is the loss of an unborn baby before the 20th week of pregnancy. Most miscarriages occur in the first 3 months of pregnancy. A miscarriage may happen before a woman knows that they're pregnant. If you lose a pregnancy, talk with your health care provider about: Any questions you have about the loss of your baby. How to work through your grief. Plans for future pregnancy. What are the causes? Many times, the cause of this condition is not known. What increases the risk? Certain medical conditions Conditions that affect hormones, such as: Thyroid problems. Polycystic ovary syndrome. Diabetes. Autoimmune disorders. Infections. Bleeding problems. Obesity. Lifestyle factors Smoking, vaping, or use of other products with tobacco or nicotine in them. Being around people who smoke. Drinking alcohol or taking certain substances. Having large amounts of caffeine. Problems with the body Scars in the uterus. Growths, such as fibroids, in the uterus. Problems in the body that are present at birth. Infection. A cervix that opens and thins before your due date. Personal or medical history Having had a miscarriage before. Being younger than age 8 or older than age 83 when you become pregnant. Being around a harmful things, such as radiation. Having lead or other heavy metals where you live or work. Use of some medicines. What are the signs or symptoms? Symptoms of this condition include: Blood or spots of blood coming from the vagina. Pain or cramps in the belly or low back. Fluid or tissue coming out of the vagina. How is this diagnosed? This condition may be diagnosed based on: A physical exam. Ultrasound. Lab tests, such as blood tests or urine tests. How is this treated? Treatment is not needed if all the pregnancy tissue came out of your uterus. If you need treatment, you may be treated with: Dilation and curettage (D&C). This removes the remaining  tissue from the uterus. Medicines. These may be given to help your body remove the last of the tissue. Antibiotics. Follow these instructions at home: Medicines Take your medicines only as told. If you were given antibiotics, take them as told. Do not stop taking them even if you start to feel better. Activity Rest as told by your provider. Ask your provider what activities are safe for you. If able, have someone help with home and family duties during this time. General instructions  Watch how much tissue comes out of the vagina. Watch the size of any blood clots that come out of the vagina. Do not have sex or douche until your provider says it is okay. Do not put things, such as tampons, in your vagina until your provider says it is okay. To help you and your partner with grieving: Talk with your provider. See a Veterinary surgeon. When you are ready, talk with your provider about: Things to do for your health. How you can be healthy if you get pregnant again. Where to find more information [employer] of Obstetricians and Gynecologists: acog.org U.S. Department of Health and Cytogeneticist of Women's Health: http://hoffman.com/ Contact a health care provider if: You have a fever or chills. There's bad-smelling fluid coming from your vagina. You have more bleeding instead of less. More tissue or blood clots come out of your vagina than you were told to expect. You become light-headed or weak. Get help right away if: Heavy bleeding soaks through 2 large pads an hour for more than 2 hours. You faint. You feel sad all the time. You think about hurting yourself. These symptoms  may be an emergency. Take one of these steps right away: Go to your nearest emergency room. Call 911. Call the Suicide & Crisis Lifeline (free and confidential): Call 3071748698 or 988. Text (828)868-1856. This information is not intended to replace advice given to you by your health care  provider. Make sure you discuss any questions you have with your health care provider. Document Revised: 11/06/2022 Document Reviewed: 06/08/2022 Elsevier Patient Education  2024 Elsevier Inc.  Aborto espontneo Miscarriage El aborto espontneo es la prdida de un beb en gestacin antes de la semana 20 del embarazo. La mayor parte de los abortos espontneos sucede [employer] primeros 3 meses de embarazo. El aborto espontneo puede ocurrir antes de que la mujer sepa que est embarazada. Si pierde un embarazo, hable con el mdico sobre lo siguiente: Todas las preguntas que tenga sobre la prdida del beb. Cmo atravesar el duelo. Planes para un futuro embarazo. Cules son las causas? Muchas veces, se desconoce la causa de esta afeccin. Qu incrementa el riesgo? Ciertas enfermedades crnicas Trastornos que [employer] hormonas, como los siguientes: Problemas de tiroides. Sndrome del ovario poliqustico. Diabetes. Trastornos autoinmunitarios. Infecciones. Problemas de sangrado. Obesidad. Factores de estilo de vida Fumar, vapear o consumir otros productos que tengan tabaco o nicotina. Estar cerca de personas que fuman. Beber alcohol o tomar ciertas sustancias. Consumir grandes cantidades de cafena. Problemas en el cuerpo Cicatrices en el tero. Crecimientos, como fibromas, en el tero. Problemas en el cuerpo que estn presentes desde el nacimiento. Infeccin. Apertura y borrado del cuello uterino antes de la fecha estimada de [place]. Antecedentes personales o mdicos Haber tenido un aborto espontneo antes. Tener menos de 18 aos o ms de 35 aos o ms al quedar embarazada. Estar cerca de cosas nocivas, como la radiacin. Tener plomo u otros Scientist, product/process development donde vive o trabaja. Uso de algunos medicamentos. Cules son los signos o sntomas? Los sntomas de esta afeccin incluyen: Sangre o Ceres de sangre procedentes de la vagina. Dolor o clicos en la  barriga o en la parte baja de la espalda. Salida de lquido o tejido por la vagina. Cmo se diagnostica? Esta afeccin se puede diagnosticar en funcin de lo siguiente: Un examen fsico. Ecografa. Pruebas de laboratorio, como anlisis de [place] o de comoros. Cmo se trata? No se necesita tratamiento si todo el tejido fetal sali del tero. Si necesita tratamiento, es posible que se la trate con: Dilatacin y curetaje (D&C). Con esto, se retira el tejido restante del tero. Medicamentos. Estos pueden administrarse para ayudar al cuerpo a retirar lo ltimo de tejido. Antibiticos. Siga estas instrucciones en su casa: Medicamentos Use sus medicamentos nicamente segn las indicaciones. Si le han recetado antibiticos, tmelos como se lo hayan indicado. No deje de tomarlos aunque comience a sentirse mejor. Actividad Haga reposo como se lo haya indicado el mdico. Pregntele al mdico qu actividades son seguras para usted. Si puede, pdale a alguien que ayude con las tareas de la casa y la familia durante este tiempo. Instrucciones generales  Observe cunto tejido sale de la vagina. Observe el tamao de cualquier cogulo de sangre que salga de la vagina. No tenga relaciones sexuales ni se haga duchas vaginales hasta que el mdico la autorice. No se ponga cosas, como tampones, en la vagina hasta que el mdico la autorice. Para ayudarlos a usted y a su pareja con el duelo: Hable con su mdico. Vaya al psiclogo. Cuando est lista, hable con el mdico sobre: [employer]  puede hacer por su salud. Cmo puede estar sana si queda embarazada de nuevo. Dnde obtener ms informacin [employer] of Obstetricians and Gynecologists ([employer] Ob[employer]etras y Scientific laboratory technician): acog.org U.S. Department of Health and [employer], Office on [employer] Health (Departamento de Salud y [employer] de los Estados [place] de Salud de la Mujer):  http://hoffman.com/ Comunquese con un mdico si: Althea duran o siente escalofros. Le sale lquido con mal olor de la vagina. El sangrado aumenta en vez de disminuir. Le salen ms tejido o cogulos de sangre por la vagina de lo que le dijeron que era de esperarse. Se siente mareada o dbil. Solicite ayuda de inmediato si: Tiene un sangrado abundante que llena 2 apsitos grandes por hora durante ms de 2 horas. Se desmaya. Se siente triste [employer]. Piensa en hacerse dao. Estos sntomas pueden Customer service manager. Tome una de estas medidas de inmediato: Dirjase al centro de urgencias ms cercano. Llame al 911. Llame a la Suicide & Crisis Lifeline (Lnea Telefnica para la Prevencin del Suicidio y las Crisis) (esta es gratuita y confidencial): Llame al [phone] o al 988. Enve un mensaje de texto al [phone]. Esta informacin no tiene Theme park manager el consejo del mdico. Asegrese de hacerle al mdico cualquier pregunta que tenga. Document Revised: 08/07/2022 Document Reviewed: 08/07/2022 Elsevier Patient Education  2024 [employer].

## 2023-10-08 ENCOUNTER — Encounter: Admitting: Advanced Practice Midwife

## 2023-10-10 ENCOUNTER — Ambulatory Visit: Admitting: Obstetrics and Gynecology

## 2023-10-22 ENCOUNTER — Ambulatory Visit: Admitting: Obstetrics

## 2023-10-22 ENCOUNTER — Other Ambulatory Visit (HOSPITAL_COMMUNITY)
Admission: RE | Admit: 2023-10-22 | Discharge: 2023-10-22 | Disposition: A | Discharge status: Home / Self Care | Source: Ambulatory Visit | Attending: Obstetrics | Admitting: Obstetrics

## 2023-10-22 ENCOUNTER — Encounter: Payer: Self-pay | Admitting: Obstetrics

## 2023-10-22 VITALS — BP 127/76 | HR 69 | Ht 62.0 in | Wt 189.5 lb

## 2023-10-22 DIAGNOSIS — E66811 Obesity, class 1: Secondary | ICD-10-CM

## 2023-10-22 DIAGNOSIS — Z01419 Encounter for gynecological examination (general) (routine) without abnormal findings: Secondary | ICD-10-CM | POA: Diagnosis present

## 2023-10-22 DIAGNOSIS — Z30011 Encounter for initial prescription of contraceptive pills: Secondary | ICD-10-CM

## 2023-10-22 DIAGNOSIS — Z1239 Encounter for other screening for malignant neoplasm of breast: Secondary | ICD-10-CM

## 2023-10-22 DIAGNOSIS — Z3009 Encounter for other general counseling and advice on contraception: Secondary | ICD-10-CM

## 2023-10-22 MED ORDER — NORETHIN ACE-ETH ESTRAD-FE 1-20 MG-MCG(24) PO TABS: 1.0000 | ORAL_TABLET | Freq: Every day | ORAL | 11 refills | Status: AC

## 2023-10-22 NOTE — Progress Notes (Signed)
 Subjective:        Gabriela Rivera is a 38 y.o. female here for a routine exam.  Current complaints: None  Personal health questionnaire:  Is patient [name], have a family history of breast and/or ovarian cancer: no Is there a family history of uterine cancer diagnosed at age < 35, gastrointestinal cancer, urinary tract cancer, family member who is a Personnel officer syndrome-associated carrier: no Is the patient overweight and hypertensive, family history of diabetes, personal history of gestational diabetes, preeclampsia or PCOS: no Is patient over 66, have PCOS,  family history of premature CHD under age 104, diabetes, smoke, have hypertension or peripheral artery disease:  no At any time, has a partner hit, kicked or otherwise hurt or frightened you?: no Over the past 2 weeks, have you felt down, depressed or hopeless?: no Over the past 2 weeks, have you felt little interest or pleasure in doing things?:no   Gynecologic History Patient's last menstrual period was 10/08/2023 (exact date). Contraception: none Last Pap: 3-4 yrs ago. Results were: normal Last mammogram: none. Results were: none  Obstetric History OB History  Gravida Para Term Preterm AB Living  7 5 5  0 2 5  SAB IAB Ectopic Multiple Live Births  2 0 0 0 5    # Outcome Date GA Lbr Len/2nd Weight Sex Type Anes PTL Lv  7 SAB 09/10/23 [redacted]w[redacted]d    SAB     6 SAB 04/25/23 [redacted]w[redacted]d         5 Term 12/06/21 [redacted]w[redacted]d 00:39 / 00:10 7 lb 1 oz (3.204 kg) F Vag-Spont None  LIV  4 Term 05/04/10    M Vag-Spont   LIV  3 Term 08/06/06    F Vag-Spont   LIV  2 Term 10/04/04    F Vag-Spont   LIV  1 Term 02/12/03    F Vag-Spont   LIV    Past Medical History:  Diagnosis Date   Diabetes mellitus without complication (HCC)    Gestational   Medical history non-contributory     Past Surgical History:  Procedure Laterality Date   CHOLECYSTECTOMY  2002   DILATION AND EVACUATION N/A 04/25/2023   Procedure: DILATION AND EVACUATION,  UTERUS;  Surgeon: Gabriela Lynwood MATSU, MD;  Location: [facility] OR;  Service: Gynecology;  Laterality: N/A;     Current Outpatient Medications:    Prenatal Vit-Fe Fumarate-FA (PRENATAL VITAMIN PO), Take 1 tablet by mouth daily., Disp: , Rfl:    Prenatal Vit-Fe Fumarate-FA (PREPLUS) 27-1 MG TABS, Take 1 tablet by mouth daily., Disp: 30 tablet, Rfl: 13   Blood Pressure Monitoring (BLOOD PRESSURE KIT) DEVI, 1 Device by Does not apply route once a week. (Patient not taking: Reported on 10/22/2023), Disp: 1 each, Rfl: 0   ibuprofen  (ADVIL ) 600 MG tablet, Take 1 tablet (600 mg total) by mouth every 6 (six) hours as needed. (Patient not taking: Reported on 10/22/2023), Disp: 30 each, Rfl: 3   Norethindrone  Acetate-Ethinyl Estrad-FE (LOESTRIN 24 FE) 1-20 MG-MCG(24) tablet, Take 1 tablet by mouth daily. (Patient not taking: Reported on 10/22/2023), Disp: 28 tablet, Rfl: 11   Norethindrone  Acetate-Ethinyl Estrad-FE (LOESTRIN 24 FE) 1-20 MG-MCG(24) tablet, Take 1 tablet by mouth daily., Disp: 28 tablet, Rfl: 11   oxyCODONE  (ROXICODONE ) 5 MG immediate release tablet, Take 1 tablet (5 mg total) by mouth every 6 (six) hours as needed for severe pain (pain score 7-10). (Patient not taking: Reported on 10/22/2023), Disp: 12 tablet, Rfl: 0   Prenatal 28-0.8 MG  TABS, Take 1 tablet by mouth daily. (Patient not taking: Reported on 10/22/2023), Disp: 30 tablet, Rfl: 12 No Known Allergies  Social History   Tobacco Use   Smoking status: Never   Smokeless tobacco: Never  Substance Use Topics   Alcohol use: No    Family History  Problem Relation Age of Onset   Diabetes Mother    Diabetes Father       Review of Systems  Constitutional: negative for fatigue and weight loss Respiratory: negative for cough and wheezing Cardiovascular: negative for chest pain, fatigue and palpitations Gastrointestinal: negative for abdominal pain and change in bowel habits Musculoskeletal:negative for myalgias Neurological: negative for  gait problems and tremors Behavioral/Psych: negative for abusive relationship, depression Endocrine: negative for temperature intolerance    Genitourinary:negative for abnormal menstrual periods, genital lesions, hot flashes, sexual problems and vaginal discharge Integument/breast: negative for breast lump, breast tenderness, nipple discharge and skin lesion(s)    Objective:       BP 127/76   Pulse 69   Ht 5' 2 (1.575 m)   Wt 189 lb 8 oz (86 kg)   LMP 10/08/2023 (Exact Date)   Breastfeeding No   BMI 34.66 kg/m  General:   Alert and no distress  Skin:   no rash or abnormalities  Lungs:   clear to auscultation bilaterally  Heart:   regular rate and rhythm, S1, S2 normal, no murmur, click, rub or gallop  Breasts:   normal without suspicious masses, skin or nipple changes or axillary nodes  Abdomen:  normal findings: no organomegaly, soft, non-tender and no hernia  Pelvis:  External genitalia: normal general appearance Urinary system: urethral meatus normal and bladder without fullness, nontender Vaginal: normal without tenderness, induration or masses Cervix: normal appearance Adnexa: normal bimanual exam Uterus: anteverted and non-tender, normal size   Lab Review Urine pregnancy test Labs reviewed yes Radiologic studies reviewed yes  I have spent a total of 20 minutes of face-to-face time, excluding clinical staff time, reviewing notes and preparing to see patient, ordering tests and/or medications, and counseling the patient.   Assessment:    1. Encounter for gynecological examination with Papanicolaou smear of cervix (Primary) Rx: - Cytology - PAP( [place])  2. Screening breast examination Rx: - MM 3D SCREENING MAMMOGRAM BILATERAL BREAST; Future  3. Encounter for other general counseling and advice on contraception - discussed options.  Wants OCP's  4. Encounter for initial prescription of contraceptive pills Rx: - Norethindrone  Acetate-Ethinyl Estrad-FE  (LOESTRIN 24 FE) 1-20 MG-MCG(24) tablet; Take 1 tablet by mouth daily.  Dispense: 28 tablet; Refill: 11  5. Obesity (BMI 30.0-34.9) - weight reduction with the aid of dietary changes, exercise and behavioral modification recommended     Plan:    Education reviewed: calcium  supplements, depression evaluation, low fat, low cholesterol diet, safe sex/STD prevention, self breast exams, and weight bearing exercise. Contraception: OCP (estrogen/progesterone). Mammogram ordered. Follow up in: 1 year.   Meds ordered this encounter  Medications   Norethindrone  Acetate-Ethinyl Estrad-FE (LOESTRIN 24 FE) 1-20 MG-MCG(24) tablet    Sig: Take 1 tablet by mouth daily.    Dispense:  28 tablet    Refill:  11   Orders Placed This Encounter  Procedures   MM 3D SCREENING MAMMOGRAM BILATERAL BREAST    Standing Status:   Future    Expiration Date:   10/21/2024    Reason for Exam (SYMPTOM  OR DIAGNOSIS REQUIRED):   Needs routine screening    Is the patient pregnant?:  No    Preferred imaging location?:   [facility]     CARLIN RONAL CENTERS, MD, FACOG Attending Obstetrician & Gynecologist, [facility] for [facility], [facility], [place] 10/22/2023

## 2023-10-22 NOTE — Progress Notes (Signed)
 Pt presents for annual/sab f/u

## 2023-10-25 LAB — CYTOLOGY - PAP
Adequacy: ABSENT
Comment: NEGATIVE
Diagnosis: NEGATIVE
High risk HPV: NEGATIVE

## 2023-11-05 ENCOUNTER — Ambulatory Visit
Admission: RE | Admit: 2023-11-05 | Discharge: 2023-11-05 | Disposition: A | Discharge status: Home / Self Care | Source: Ambulatory Visit | Attending: Obstetrics | Admitting: Obstetrics

## 2023-11-05 DIAGNOSIS — Z1239 Encounter for other screening for malignant neoplasm of breast: Secondary | ICD-10-CM
